# Patient Record
Sex: Male | Born: 1965 | Race: White | Hispanic: No | Marital: Married | Smoking: Former smoker
Health system: Southern US, Community
[De-identification: ages and names within clinical notes are randomized; demographics above are authoritative.]

## PROBLEM LIST (undated history)

## (undated) DIAGNOSIS — E785 Hyperlipidemia, unspecified: Secondary | ICD-10-CM

## (undated) DIAGNOSIS — G43909 Migraine, unspecified, not intractable, without status migrainosus: Secondary | ICD-10-CM

## (undated) DIAGNOSIS — I10 Essential (primary) hypertension: Secondary | ICD-10-CM

## (undated) DIAGNOSIS — G4730 Sleep apnea, unspecified: Secondary | ICD-10-CM

## (undated) HISTORY — PX: VEIN SURGERY: SHX48

## (undated) HISTORY — DX: Migraine, unspecified, not intractable, without status migrainosus: G43.909

## (undated) HISTORY — PX: APPENDECTOMY: SHX54

## (undated) HISTORY — PX: COLON SURGERY: SHX602

## (undated) HISTORY — DX: Hyperlipidemia, unspecified: E78.5

---

## 2013-03-09 ENCOUNTER — Ambulatory Visit: Payer: Self-pay | Admitting: Family Medicine

## 2013-12-11 DIAGNOSIS — R600 Localized edema: Secondary | ICD-10-CM | POA: Insufficient documentation

## 2013-12-11 DIAGNOSIS — G43109 Migraine with aura, not intractable, without status migrainosus: Secondary | ICD-10-CM | POA: Insufficient documentation

## 2014-03-18 DIAGNOSIS — G4733 Obstructive sleep apnea (adult) (pediatric): Secondary | ICD-10-CM | POA: Insufficient documentation

## 2014-04-09 ENCOUNTER — Ambulatory Visit: Payer: Self-pay | Admitting: Unknown Physician Specialty

## 2014-06-21 LAB — SURGICAL PATHOLOGY

## 2015-07-05 ENCOUNTER — Other Ambulatory Visit: Payer: Self-pay | Admitting: Internal Medicine

## 2015-07-05 DIAGNOSIS — G43019 Migraine without aura, intractable, without status migrainosus: Secondary | ICD-10-CM

## 2015-07-12 ENCOUNTER — Ambulatory Visit
Admission: RE | Admit: 2015-07-12 | Discharge: 2015-07-12 | Disposition: A | Discharge status: Home / Self Care | Payer: 59 | Source: Ambulatory Visit | Attending: Internal Medicine | Admitting: Internal Medicine

## 2015-07-12 DIAGNOSIS — G43001 Migraine without aura, not intractable, with status migrainosus: Secondary | ICD-10-CM | POA: Diagnosis not present

## 2015-07-12 DIAGNOSIS — R51 Headache: Secondary | ICD-10-CM | POA: Insufficient documentation

## 2015-07-12 DIAGNOSIS — G43019 Migraine without aura, intractable, without status migrainosus: Secondary | ICD-10-CM

## 2015-07-12 HISTORY — DX: Essential (primary) hypertension: I10

## 2015-07-12 MED ORDER — IOPAMIDOL (ISOVUE-300) INJECTION 61%
75.0000 mL | Freq: Once | INTRAVENOUS | Status: AC | PRN
  Administered 2015-07-12: 75 mL via INTRAVENOUS

## 2016-07-02 ENCOUNTER — Encounter (INDEPENDENT_AMBULATORY_CARE_PROVIDER_SITE_OTHER): Payer: Self-pay | Admitting: Vascular Surgery

## 2016-07-02 ENCOUNTER — Ambulatory Visit (INDEPENDENT_AMBULATORY_CARE_PROVIDER_SITE_OTHER): Payer: BLUE CROSS/BLUE SHIELD | Admitting: Vascular Surgery

## 2016-07-02 DIAGNOSIS — M79605 Pain in left leg: Secondary | ICD-10-CM | POA: Diagnosis not present

## 2016-07-02 DIAGNOSIS — I872 Venous insufficiency (chronic) (peripheral): Secondary | ICD-10-CM | POA: Diagnosis not present

## 2016-07-02 DIAGNOSIS — M79604 Pain in right leg: Secondary | ICD-10-CM | POA: Diagnosis not present

## 2016-07-02 DIAGNOSIS — M79609 Pain in unspecified limb: Secondary | ICD-10-CM | POA: Insufficient documentation

## 2016-07-02 DIAGNOSIS — K219 Gastro-esophageal reflux disease without esophagitis: Secondary | ICD-10-CM | POA: Insufficient documentation

## 2016-07-02 DIAGNOSIS — I83813 Varicose veins of bilateral lower extremities with pain: Secondary | ICD-10-CM

## 2016-07-02 NOTE — Progress Notes (Signed)
MRN : [id number]  Christian James is a 51 y.o. (09-21-1965) male who presents with chief complaint of  Chief Complaint  Patient presents with  . New Evaluation    Varicose Veins  .  History of Present Illness: The patient is seen for evaluation of symptomatic varicose veins. The patient relates burning and stinging which worsened steadily throughout the course of the day, particularly with standing. The patient also notes an aching and throbbing pain over the varicosities, particularly with prolonged dependent positions. The symptoms are significantly improved with elevation.  The patient also notes that during hot weather the symptoms are greatly intensified. The patient states the pain from the varicose veins interferes with work, daily exercise, shopping and household maintenance. At this point, the symptoms are persistent and severe enough that they're having a negative impact on lifestyle and are interfering with daily activities.  There is no history of DVT, PE or superficial thrombophlebitis. There is no history of ulceration or hemorrhage. The patient has a significant family history of varicose veins, his mother had stripping.   The patient has worn graduated compression in the past. At the present time the patient has not been using over-the-counter analgesics. There is no history of prior surgical intervention or sclerotherapy.    Current Meds  Medication Sig  . ACIDOPHILUS LACTOBACILLUS PO Take by mouth.  . butalbital-acetaminophen-caffeine (FIORICET, ESGIC) 50-325-40 MG tablet TAKE 1 TABLET EVERY 4 HOURS AS NEEDED FOR PAIN  . furosemide (LASIX) 20 MG tablet TAKE 1 TABLET (20 MG TOTAL) BY MOUTH ONCE DAILY.  . hydrochlorothiazide (HYDRODIURIL) 25 MG tablet TAKE 1 TABLET (25 MG TOTAL) BY MOUTH ONCE DAILY.  Marland Kitchen KLOR-CON 8 MEQ tablet Take 16 mEq by mouth daily.  Javier Docker Oil 1000 MG CAPS Take by mouth.  Marland Kitchen omeprazole (PRILOSEC) 20 MG capsule TAKE 1 CAPSULE (20 MG TOTAL) BY MOUTH  ONCE DAILY.  Marland Kitchen promethazine (PHENERGAN) 25 MG tablet Take by mouth.  . topiramate (TOPAMAX) 100 MG tablet TAKE 2 TABLETS (200 MG TOTAL) BY MOUTH NIGHTLY.    Past Medical History:  Diagnosis Date  . Hyperlipidemia   . Migraines     Past Surgical History:  Procedure Laterality Date  . APPENDECTOMY    . COLON SURGERY      Social History Social History  Substance Use Topics  . Smoking status: Former Smoker    Quit date: 2016  . Smokeless tobacco: Never Used  . Alcohol use Yes    Family History Family History  Problem Relation Age of Onset  . Varicose Veins Mother   . Stroke Mother   . Heart disease Mother   . Cancer Mother   No family history of bleeding/clotting disorders, porphyria or autoimmune disease   No Known Allergies   REVIEW OF SYSTEMS (Negative unless checked)  Constitutional: [] Weight loss  [] Fever  [] Chills Cardiac: [] Chest pain   [] Chest pressure   [] Palpitations   [] Shortness of breath when laying flat   [] Shortness of breath with exertion. Vascular:  [] Pain in legs with walking   [x] Pain in legs with standing  [] History of DVT   [] Phlebitis   [x] Swelling in legs   [x] Varicose veins   [] Non-healing ulcers Pulmonary:   [] Uses home oxygen   [] Productive cough   [] Hemoptysis   [] Wheeze  [] COPD   [] Asthma Neurologic:  [] Dizziness   [] Seizures   [] History of stroke   [] History of TIA  [] Aphasia   [] Vissual changes   [] Weakness or numbness in arm   []   Weakness or numbness in leg Musculoskeletal:   [] Joint swelling   [] Joint pain   [] Low back pain Hematologic:  [] Easy bruising  [] Easy bleeding   [] Hypercoagulable state   [] Anemic Gastrointestinal:  [] Diarrhea   [] Vomiting  [] Gastroesophageal reflux/heartburn   [] Difficulty swallowing. Genitourinary:  [] Chronic kidney disease   [] Difficult urination  [] Frequent urination   [] Blood in urine Skin:  [] Rashes   [] Ulcers  Psychological:  [] History of anxiety   []  History of major depression.  Physical  Examination  Vitals:   07/02/16 1309  BP: 117/74  Pulse: 60  Resp: 16  Weight: (!) 323 lb (146.5 kg)  Height: 6\' 2"  (1.88 m)   Body mass index is 41.47 kg/m. Gen: WD/WN, NAD Head: [place]/AT, No temporalis wasting.  Ear/Nose/Throat: Hearing grossly intact, nares w/o erythema or drainage, poor dentition Eyes: PER, EOMI, sclera nonicteric.  Neck: Supple, no masses.  No bruit or JVD.  Pulmonary:  Good air movement, clear to auscultation bilaterally, no use of accessory muscles.  Cardiac: RRR, normal S1, S2, no Murmurs. Vascular: Large varicosities present extensively greater than 10 mm left more than right.  Mild venous stasis changes to the legs bilaterally.  1+ soft pitting edema Vessel Right Left  Popliteal Palpable Palpable  PT Palpable Palpable  DP Palpable Palpable   Gastrointestinal: soft, non-distended. No guarding/no peritoneal signs.  Musculoskeletal: M/S 5/5 throughout.  No deformity or atrophy.  Neurologic: CN 2-12 intact. Pain and light touch intact in extremities.  Symmetrical.  Speech is fluent. Motor exam as listed above. Psychiatric: Judgment intact, Mood & affect appropriate for pt's clinical situation. Dermatologic: No rashes or ulcers noted.  No changes consistent with cellulitis. Lymph : No Cervical lymphadenopathy, no lichenification or skin changes of chronic lymphedema.  CBC No results found for: WBC, HGB, HCT, MCV, PLT  BMET No results found for: NA, K, CL, CO2, GLUCOSE, BUN, CREATININE, CALCIUM, GFRNONAA, GFRAA CrCl cannot be calculated (No order found.).  COAG No results found for: INR, PROTIME  Radiology No results found.  Assessment/Plan 1. Varicose veins of both lower extremities with pain  Recommend:  The patient has large symptomatic varicose veins that are painful and associated with swelling.  I have had a long discussion with the patient regarding  varicose veins and why they cause symptoms.  Patient will begin wearing graduated  compression stockings class 1 on a daily basis, beginning first thing in the morning and removing them in the evening. The patient is instructed specifically not to sleep in the stockings.    The patient  will also begin using over-the-counter analgesics such as Motrin 600 mg po TID to help control the symptoms.    In addition, behavioral modification including elevation during the day will be initiated.    Pending the results of these changes the  patient will be reevaluated in three months.   An  ultrasound of the venous system will be obtained.   Further plans will be based on the ultrasound results and whether conservative therapies are successful at eliminating the pain and swelling.   - VAS [place] LOWER EXTREMITY VENOUS REFLUX; Future  2. Chronic venous insufficiency No surgery or intervention at this point in time.    I have had a long discussion with the patient regarding venous insufficiency and why it  causes symptoms. I have discussed with the patient the chronic skin changes that accompany venous insufficiency and the long term sequela such as infection and ulceration.  Patient will begin wearing graduated compression  stockings class 1 (20-30 mmHg) or compression wraps on a daily basis a prescription was given. The patient will put the stockings on first thing in the morning and removing them in the evening. The patient is instructed specifically not to sleep in the stockings.    In addition, behavioral modification including several periods of elevation of the lower extremities during the day will be continued. I have demonstrated that proper elevation is a position with the ankles at heart level.  The patient is instructed to begin routine exercise, especially walking on a daily basis  Patient should undergo duplex ultrasound of the venous system to ensure that DVT or reflux is not present.  Following the review of the ultrasound the patient will follow up in 2-3 months to reassess  the degree of swelling and the control that graduated compression stockings or compression wraps  is offering.     3. Pain in both lower extremities See #1&2  4. Gastroesophageal reflux disease without esophagitis Continue PPI as already ordered, these medications have been reviewed and there are no changes at this time.     Hortencia Pilar, MD  07/02/2016 1:53 PM

## 2016-10-03 ENCOUNTER — Ambulatory Visit (INDEPENDENT_AMBULATORY_CARE_PROVIDER_SITE_OTHER): Payer: BLUE CROSS/BLUE SHIELD

## 2016-10-03 ENCOUNTER — Ambulatory Visit (INDEPENDENT_AMBULATORY_CARE_PROVIDER_SITE_OTHER): Payer: BLUE CROSS/BLUE SHIELD | Admitting: Vascular Surgery

## 2016-10-03 DIAGNOSIS — I83813 Varicose veins of bilateral lower extremities with pain: Secondary | ICD-10-CM | POA: Diagnosis not present

## 2017-01-03 ENCOUNTER — Ambulatory Visit (INDEPENDENT_AMBULATORY_CARE_PROVIDER_SITE_OTHER): Payer: BLUE CROSS/BLUE SHIELD | Admitting: Vascular Surgery

## 2017-01-03 ENCOUNTER — Encounter (INDEPENDENT_AMBULATORY_CARE_PROVIDER_SITE_OTHER): Payer: BLUE CROSS/BLUE SHIELD

## 2017-01-10 ENCOUNTER — Encounter (INDEPENDENT_AMBULATORY_CARE_PROVIDER_SITE_OTHER): Payer: Self-pay | Admitting: Vascular Surgery

## 2017-01-10 ENCOUNTER — Ambulatory Visit (INDEPENDENT_AMBULATORY_CARE_PROVIDER_SITE_OTHER): Payer: BLUE CROSS/BLUE SHIELD | Admitting: Vascular Surgery

## 2017-01-10 VITALS — BP 147/87 | HR 71 | Resp 16 | Wt 321.0 lb

## 2017-01-10 DIAGNOSIS — M79604 Pain in right leg: Secondary | ICD-10-CM | POA: Diagnosis not present

## 2017-01-10 DIAGNOSIS — K219 Gastro-esophageal reflux disease without esophagitis: Secondary | ICD-10-CM

## 2017-01-10 DIAGNOSIS — I872 Venous insufficiency (chronic) (peripheral): Secondary | ICD-10-CM | POA: Diagnosis not present

## 2017-01-10 DIAGNOSIS — M79605 Pain in left leg: Secondary | ICD-10-CM

## 2017-01-10 DIAGNOSIS — I83813 Varicose veins of bilateral lower extremities with pain: Secondary | ICD-10-CM | POA: Diagnosis not present

## 2017-01-11 ENCOUNTER — Encounter (INDEPENDENT_AMBULATORY_CARE_PROVIDER_SITE_OTHER): Payer: Self-pay | Admitting: Vascular Surgery

## 2017-01-11 NOTE — Progress Notes (Signed)
MRN : [id number]  Christian James is a 51 y.o. (February 17, 1966) male who presents with chief complaint of  Chief Complaint  Patient presents with  . Follow-up    51mos. BIL reflux   .  History of Present Illness: The patient returns for followup evaluation 3 months after the initial visit. The patient continues to have pain in the lower extremities with dependency. The pain is lessened with elevation. Graduated compression stockings, Class I (20-30 mmHg), have been worn but the stockings do not eliminate the leg pain. Over-the-counter analgesics do not improve the symptoms. The degree of discomfort continues to interfere with daily activities. The patient notes the pain in the legs is causing problems with daily exercise, at the workplace and even with household activities and maintenance such as standing in the kitchen preparing meals and doing dishes.   Venous ultrasound shows normal deep venous system, no evidence of acute or chronic DVT.  Superficial reflux is present in the bilateral great and small saphenous veins  Current Meds  Medication Sig  . ACIDOPHILUS LACTOBACILLUS PO Take by mouth.  . butalbital-acetaminophen-caffeine (FIORICET, ESGIC) 50-325-40 MG tablet TAKE 1 TABLET EVERY 4 HOURS AS NEEDED FOR PAIN  . furosemide (LASIX) 20 MG tablet TAKE 1 TABLET (20 MG TOTAL) BY MOUTH ONCE DAILY.  . hydrochlorothiazide (HYDRODIURIL) 25 MG tablet TAKE 1 TABLET (25 MG TOTAL) BY MOUTH ONCE DAILY.  Marland Kitchen KLOR-CON 8 MEQ tablet Take 16 mEq by mouth daily.  Javier Docker Oil 1000 MG CAPS Take by mouth.  Marland Kitchen omeprazole (PRILOSEC) 20 MG capsule TAKE 1 CAPSULE (20 MG TOTAL) BY MOUTH ONCE DAILY.  Marland Kitchen promethazine (PHENERGAN) 25 MG tablet Take by mouth.  . topiramate (TOPAMAX) 100 MG tablet TAKE 2 TABLETS (200 MG TOTAL) BY MOUTH NIGHTLY.    Past Medical History:  Diagnosis Date  . Hyperlipidemia   . Migraines     Past Surgical History:  Procedure Laterality Date  . APPENDECTOMY    . COLON SURGERY       Social History Social History   Tobacco Use  . Smoking status: Former Smoker    Last attempt to quit: 2016    Years since quitting: 2.8  . Smokeless tobacco: Never Used  Substance Use Topics  . Alcohol use: Yes  . Drug use: No    Family History Family History  Problem Relation Age of Onset  . Varicose Veins Mother   . Stroke Mother   . Heart disease Mother   . Cancer Mother     No Known Allergies   REVIEW OF SYSTEMS (Negative unless checked)  Constitutional: [] Weight loss  [] Fever  [] Chills Cardiac: [] Chest pain   [] Chest pressure   [] Palpitations   [] Shortness of breath when laying flat   [] Shortness of breath with exertion. Vascular:  [] Pain in legs with walking   [x] Pain in legs with standing[] History of DVT   [] Phlebitis   [x] Swelling in legs   [x] Varicose veins   [] Non-healing ulcers Pulmonary:   [] Uses home oxygen   [] Productive cough   [] Hemoptysis   [] Wheeze  [] COPD   [] Asthma Neurologic:  [] Dizziness   [] Seizures   [] History of stroke   [] History of TIA  [] Aphasia   [] Vissual changes   [] Weakness or numbness in arm   [] Weakness or numbness in leg Musculoskeletal:   [] Joint swelling   [] Joint pain   [] Low back pain Hematologic:  [] Easy bruising  [] Easy bleeding   [x] Hypercoagulable state   [] Anemic Gastrointestinal:  [] Diarrhea   []   Vomiting  [] Gastroesophageal reflux/heartburn   [] Difficulty swallowing. Genitourinary:  [] Chronic kidney disease   [] Difficult urination  [] Frequent urination   [] Blood in urine Skin:  [] Rashes   [] Ulcers  Psychological:  [] History of anxiety   []  History of major depression.  Physical Examination  Vitals:   01/10/17 1542  BP: (!) 147/87  Pulse: 71  Resp: 16  Weight: (!) 145.6 kg (321 lb)   Body mass index is 41.21 kg/m. Gen: WD/WN, NAD Head: [place]/AT, No temporalis wasting.  Ear/Nose/Throat: Hearing grossly intact, nares w/o erythema or drainage Eyes: PER, EOMI, sclera nonicteric.  Neck: Supple, no large masses.    Pulmonary:  Good air movement, no audible wheezing bilaterally, no use of accessory muscles.  Cardiac: RRR, no JVD Vascular: Large varicosities present extensively greater than 10 mm bilaterally.  Moderate venous stasis changes to the legs bilaterally.  2-3+ soft pitting edema Vessel Right Left  Radial Palpable Palpable  PT Palpable Palpable  DP Palpable Palpable  Gastrointestinal: Non-distended. No guarding/no peritoneal signs.  Musculoskeletal: M/S 5/5 throughout.  No deformity or atrophy.  Neurologic: CN 2-12 intact. Symmetrical.  Speech is fluent. Motor exam as listed above. Psychiatric: Judgment intact, Mood & affect appropriate for pt's clinical situation. Dermatologic: No rashes or ulcers noted.  No changes consistent with cellulitis. Lymph : No lichenification or skin changes of chronic lymphedema.  CBC No results found for: WBC, HGB, HCT, MCV, PLT  BMET No results found for: NA, K, CL, CO2, GLUCOSE, BUN, CREATININE, CALCIUM, GFRNONAA, GFRAA CrCl cannot be calculated (No order found.).  COAG No results found for: INR, PROTIME  Radiology No results found.   Assessment/Plan 1. Chronic venous insufficiency Recommend  I have reviewed my previous  discussion with the patient regarding  varicose veins and why they cause symptoms. Patient will continue  wearing graduated compression stockings class 1 on a daily basis, beginning first thing in the morning and removing them in the evening.    In addition, behavioral modification including elevation during the day was again discussed and this will continue.  The patient has utilized over the counter pain medications and has been exercising.  However, at this time conservative therapy has not alleviated the patient's symptoms of leg pain and swelling  Recommend: laser ablation of the right and  left great and small saphenous veins to eliminate the symptoms of pain and swelling of the lower extremities caused by the severe  superficial venous reflux disease.  Patient requests left leg first as this is more symptomatic to him.  2. Varicose veins of both lower extremities with pain See #1  3. Pain in both lower extremities See #1  4. Gastroesophageal reflux disease without esophagitis Continue PPI as already ordered, these medications have been reviewed and there are no changes at this time.     Hortencia Pilar, MD  01/11/2017 9:43 PM

## 2017-01-16 ENCOUNTER — Telehealth (INDEPENDENT_AMBULATORY_CARE_PROVIDER_SITE_OTHER): Payer: Self-pay | Admitting: Vascular Surgery

## 2017-01-16 NOTE — Telephone Encounter (Signed)
April and I called the patient back with instructions.

## 2017-01-16 NOTE — Telephone Encounter (Signed)
The cost of laser ablation is based on your insurance.  The patient should call her insurance company and inquire about any out of pocket cost.  The patient will be out of work the day of the laser ablation he can return to work the following day.

## 2017-01-16 NOTE — Telephone Encounter (Signed)
New Message  Pts wife verbalized she is wanting to know the cost of the procedure and steps about wearing the hose and how many days will pt be out of work.  Please f/u

## 2017-02-07 ENCOUNTER — Telehealth (INDEPENDENT_AMBULATORY_CARE_PROVIDER_SITE_OTHER): Payer: Self-pay | Admitting: Vascular Surgery

## 2017-02-07 NOTE — Telephone Encounter (Signed)
New Message   *STAT* If patient is at the pharmacy, call can be transferred to refill team.   1. Which medications need to be refilled? (please list name of each medication and dose if known) xanax  2. Which pharmacy/location (including street and city if local pharmacy) is medication to be sent to? [address] [place], [place] [address]  3. Do they need a 30 day or 90 day supply? 30 day supply  Pt is needing rx for procedure he has on Monday and rx that went out with paperwork went to wrong address and got lost in mail.  Pt updated address [phone].

## 2017-02-07 NOTE — Telephone Encounter (Signed)
Called the patient's prescription in because he states that he never received his packet of paperwork in the mail that would have had the prescription in it for the Xanax .5mg 

## 2017-02-11 ENCOUNTER — Ambulatory Visit (INDEPENDENT_AMBULATORY_CARE_PROVIDER_SITE_OTHER): Payer: BLUE CROSS/BLUE SHIELD | Admitting: Vascular Surgery

## 2017-02-11 ENCOUNTER — Encounter (INDEPENDENT_AMBULATORY_CARE_PROVIDER_SITE_OTHER): Payer: Self-pay | Admitting: Vascular Surgery

## 2017-02-11 VITALS — BP 116/73 | HR 82 | Resp 15 | Ht 74.0 in | Wt 324.0 lb

## 2017-02-11 DIAGNOSIS — I83813 Varicose veins of bilateral lower extremities with pain: Secondary | ICD-10-CM | POA: Diagnosis not present

## 2017-02-13 ENCOUNTER — Other Ambulatory Visit (INDEPENDENT_AMBULATORY_CARE_PROVIDER_SITE_OTHER): Payer: Self-pay | Admitting: Vascular Surgery

## 2017-02-13 DIAGNOSIS — I83893 Varicose veins of bilateral lower extremities with other complications: Secondary | ICD-10-CM

## 2017-02-15 ENCOUNTER — Ambulatory Visit (INDEPENDENT_AMBULATORY_CARE_PROVIDER_SITE_OTHER): Payer: BLUE CROSS/BLUE SHIELD

## 2017-02-15 DIAGNOSIS — I83893 Varicose veins of bilateral lower extremities with other complications: Secondary | ICD-10-CM | POA: Diagnosis not present

## 2017-02-16 ENCOUNTER — Encounter (INDEPENDENT_AMBULATORY_CARE_PROVIDER_SITE_OTHER): Payer: Self-pay | Admitting: Vascular Surgery

## 2017-02-16 NOTE — Progress Notes (Signed)
    MRN : [id number]  Christian James is a 51 y.o. (11/29/1965) male who presents with chief complaint of  Chief Complaint  Patient presents with  . Venous Insufficiency    Right GSV laser ablation  .    The patient's right lower extremity was sterilely prepped and draped.  The ultrasound machine was used to visualize the great saphenous vein throughout its course.  A segment below the knee was selected for access.  The saphenous vein was accessed without difficulty using ultrasound guidance with a micropuncture needle.   An 0.018  wire was placed beyond the saphenofemoral junction through the sheath and the microneedle was removed.  The 65 cm sheath was then placed over the wire and the wire and dilator were removed.  The laser fiber was placed through the sheath and its tip was placed approximately 2 cm below the saphenofemoral junction.  Tumescent anesthesia was then created with a dilute lidocaine solution.  Laser energy was then delivered with constant withdrawal of the sheath and laser fiber.  Approximately 1090 Joules of energy were delivered over a length of 34 cm.  Sterile dressings were placed.  The patient tolerated the procedure well without complications.

## 2017-03-07 ENCOUNTER — Encounter (INDEPENDENT_AMBULATORY_CARE_PROVIDER_SITE_OTHER): Payer: Self-pay | Admitting: Vascular Surgery

## 2017-03-07 ENCOUNTER — Ambulatory Visit (INDEPENDENT_AMBULATORY_CARE_PROVIDER_SITE_OTHER): Payer: BLUE CROSS/BLUE SHIELD | Admitting: Vascular Surgery

## 2017-03-07 VITALS — BP 116/78 | HR 67 | Resp 17 | Wt 317.6 lb

## 2017-03-07 DIAGNOSIS — I872 Venous insufficiency (chronic) (peripheral): Secondary | ICD-10-CM

## 2017-03-07 DIAGNOSIS — I83813 Varicose veins of bilateral lower extremities with pain: Secondary | ICD-10-CM

## 2017-03-09 ENCOUNTER — Encounter (INDEPENDENT_AMBULATORY_CARE_PROVIDER_SITE_OTHER): Payer: Self-pay | Admitting: Vascular Surgery

## 2017-03-09 NOTE — Progress Notes (Signed)
    MRN : [id number]  Christian James is a 52 y.o. (1966-01-04) male who presents with chief complaint of painful varicose veins.    The patient's left lower extremity was sterilely prepped and draped.  The ultrasound machine was used to visualize the left great saphenous vein throughout its course.  A segment below the knee was selected for access.  The saphenous vein was accessed without difficulty using ultrasound guidance with a micropuncture needle.   An 0.018  wire was placed beyond the saphenofemoral junction through the sheath and the microneedle was removed.  The 65 cm sheath was then placed over the wire and the wire and dilator were removed.  The laser fiber was placed through the sheath and its tip was placed approximately 2 cm below the saphenofemoral junction.  Tumescent anesthesia was then created with a dilute lidocaine solution.  Laser energy was then delivered with constant withdrawal of the sheath and laser fiber.  Approximately 1437 Joules of energy were delivered over a length of 43 cm.  Sterile dressings were placed.  The patient tolerated the procedure well without complications.

## 2017-03-11 ENCOUNTER — Encounter (INDEPENDENT_AMBULATORY_CARE_PROVIDER_SITE_OTHER): Payer: BLUE CROSS/BLUE SHIELD

## 2017-03-11 ENCOUNTER — Other Ambulatory Visit (INDEPENDENT_AMBULATORY_CARE_PROVIDER_SITE_OTHER): Payer: Self-pay | Admitting: Vascular Surgery

## 2017-03-11 DIAGNOSIS — I872 Venous insufficiency (chronic) (peripheral): Secondary | ICD-10-CM

## 2017-03-12 ENCOUNTER — Ambulatory Visit (INDEPENDENT_AMBULATORY_CARE_PROVIDER_SITE_OTHER): Payer: BLUE CROSS/BLUE SHIELD

## 2017-03-12 DIAGNOSIS — I872 Venous insufficiency (chronic) (peripheral): Secondary | ICD-10-CM | POA: Diagnosis not present

## 2017-04-04 ENCOUNTER — Ambulatory Visit (INDEPENDENT_AMBULATORY_CARE_PROVIDER_SITE_OTHER): Payer: BLUE CROSS/BLUE SHIELD | Admitting: Vascular Surgery

## 2017-04-04 ENCOUNTER — Encounter (INDEPENDENT_AMBULATORY_CARE_PROVIDER_SITE_OTHER): Payer: Self-pay | Admitting: Vascular Surgery

## 2017-04-04 VITALS — BP 120/75 | HR 70 | Resp 16 | Ht 74.0 in | Wt 321.0 lb

## 2017-04-04 DIAGNOSIS — I83813 Varicose veins of bilateral lower extremities with pain: Secondary | ICD-10-CM | POA: Diagnosis not present

## 2017-04-04 DIAGNOSIS — I872 Venous insufficiency (chronic) (peripheral): Secondary | ICD-10-CM | POA: Diagnosis not present

## 2017-04-07 ENCOUNTER — Encounter (INDEPENDENT_AMBULATORY_CARE_PROVIDER_SITE_OTHER): Payer: Self-pay | Admitting: Vascular Surgery

## 2017-04-07 NOTE — Progress Notes (Signed)
MRN : [id number]  Christian James is a 52 y.o. ([birth year]) male who presents with chief complaint of  Chief Complaint  Patient presents with  . Follow-up    3-4 week f/u  .  History of Present Illness: The patient returns to the office for followup status post laser ablation of the the bilateral great saphenous veins.  The patient note significant improvement in the lower extremity pain but not resolution of the symptoms. The patient notes multiple residual varicosities bilaterally which continued to hurt with dependent positions and remained tender to palpation. The patient's swelling is minimally from preoperative status. The patient continues to wear graduated compression stockings on a daily basis but these are not eliminating the pain and discomfort. The patient continues to use over-the-counter anti-inflammatory medications to treat the pain and related symptoms but this has not given the patient relief. The patient notes the pain in the lower extremities is causing problems with daily exercise, problems at work and even with household activities such as preparing meals and doing dishes.  The patient is otherwise done well and there have been no complications related to the laser procedure or interval changes in the patient's overall   Post laser ultrasound shows successful ablation of the bilateal GSV   Current Meds  Medication Sig  . ACIDOPHILUS LACTOBACILLUS PO Take by mouth.  . butalbital-acetaminophen-caffeine (FIORICET, ESGIC) 50-325-40 MG tablet TAKE 1 TABLET EVERY 4 HOURS AS NEEDED FOR PAIN  . furosemide (LASIX) 20 MG tablet TAKE 1 TABLET (20 MG TOTAL) BY MOUTH ONCE DAILY.  . hydrochlorothiazide (HYDRODIURIL) 25 MG tablet TAKE 1 TABLET (25 MG TOTAL) BY MOUTH ONCE DAILY.  Marland Kitchen KLOR-CON 8 MEQ tablet Take 16 mEq by mouth daily.  Marland Kitchen KLOR-CON M20 20 MEQ tablet TAKE 1 TABLET (20 MEQ TOTAL) BY MOUTH ONCE DAILY.  Marland Kitchen Krill Oil 1000 MG CAPS Take by mouth.  Marland Kitchen omeprazole (PRILOSEC) 20 MG  capsule TAKE 1 CAPSULE (20 MG TOTAL) BY MOUTH ONCE DAILY.  Marland Kitchen promethazine (PHENERGAN) 25 MG tablet Take by mouth.  . topiramate (TOPAMAX) 100 MG tablet TAKE 2 TABLETS (200 MG TOTAL) BY MOUTH NIGHTLY.    Past Medical History:  Diagnosis Date  . Hyperlipidemia   . Migraines     Past Surgical History:  Procedure Laterality Date  . APPENDECTOMY    . COLON SURGERY      Social History Social History   Tobacco Use  . Smoking status: Former Smoker    Last attempt to quit: 2016    Years since quitting: 3.1  . Smokeless tobacco: Never Used  Substance Use Topics  . Alcohol use: Yes  . Drug use: No    Family History Family History  Problem Relation Age of Onset  . Varicose Veins Mother   . Stroke Mother   . Heart disease Mother   . Cancer Mother     No Known Allergies   REVIEW OF SYSTEMS (Negative unless checked)  Constitutional: [] Weight loss  [] Fever  [] Chills Cardiac: [] Chest pain   [] Chest pressure   [] Palpitations   [] Shortness of breath when laying flat   [] Shortness of breath with exertion. Vascular:  [] Pain in legs with walking   [x] Pain in legs at rest  [] History of DVT   [] Phlebitis   [x] Swelling in legs   [x] Varicose veins   [] Non-healing ulcers Pulmonary:   [] Uses home oxygen   [] Productive cough   [] Hemoptysis   [] Wheeze  [] COPD   [] Asthma Neurologic:  [] Dizziness   [] Seizures   []   History of stroke   [] History of TIA  [] Aphasia   [] Vissual changes   [] Weakness or numbness in arm   [] Weakness or numbness in leg Musculoskeletal:   [] Joint swelling   [] Joint pain   [] Low back pain Hematologic:  [] Easy bruising  [] Easy bleeding   [] Hypercoagulable state   [] Anemic Gastrointestinal:  [] Diarrhea   [] Vomiting  [] Gastroesophageal reflux/heartburn   [] Difficulty swallowing. Genitourinary:  [] Chronic kidney disease   [] Difficult urination  [] Frequent urination   [] Blood in urine Skin:  [] Rashes   [] Ulcers  Psychological:  [] History of anxiety   []  History of major  depression.  Physical Examination  Vitals:   04/04/17 1423  BP: 120/75  Pulse: 70  Resp: 16  Weight: (!) 321 lb (145.6 kg)  Height: 6\' 2"  (1.88 m)   Body mass index is 41.21 kg/m. Gen: WD/WN, NAD Head: [place]/AT, No temporalis wasting.  Ear/Nose/Throat: Hearing grossly intact, nares w/o erythema or drainage Eyes: PER, EOMI, sclera nonicteric.  Neck: Supple, no large masses.   Pulmonary:  Good air movement, no audible wheezing bilaterally, no use of accessory muscles.  Cardiac: RRR, no JVD Vascular: Large varicosities present extensively greater than 10 mm bilateral.  Mild venous stasis changes to the legs bilaterally.  2+ soft pitting edema Vessel Right Left  Radial Palpable Palpable  PT Palpable Palpable  DP Palpable Palpable  Gastrointestinal: Non-distended. No guarding/no peritoneal signs.  Musculoskeletal: M/S 5/5 throughout.  No deformity or atrophy.  Neurologic: CN 2-12 intact. Symmetrical.  Speech is fluent. Motor exam as listed above. Psychiatric: Judgment intact, Mood & affect appropriate for pt's clinical situation. Dermatologic: No rashes or ulcers noted.  No changes consistent with cellulitis. Lymph : No lichenification or skin changes of chronic lymphedema.  CBC No results found for: WBC, HGB, HCT, MCV, PLT  BMET No results found for: NA, K, CL, CO2, GLUCOSE, BUN, CREATININE, CALCIUM, GFRNONAA, GFRAA CrCl cannot be calculated (No order found.).  COAG No results found for: INR, PROTIME  Radiology   Assessment/Plan 1. Varicose veins of both lower extremities with pain Recommend:  The patient has had successful ablation of the previously incompetent saphenous venous system but still has persistent symptoms of pain and swelling that are having a negative impact on daily life and daily activities.  Patient should undergo injection sclerotherapy to treat the residual varicosities.  The risks, benefits and alternative therapies were reviewed in detail with the  patient.  All questions were answered.  The patient agrees to proceed with sclerotherapy at their convenience.  The patient will continue wearing the graduated compression stockings and using the over-the-counter pain medications to treat her symptoms.     2. Chronic venous insufficiency No surgery or intervention at this point in time.    I have had a long discussion with the patient regarding venous insufficiency and why it  causes symptoms. I have discussed with the patient the chronic skin changes that accompany venous insufficiency and the long term sequela such as infection and ulceration.  Patient will begin wearing graduated compression stockings class 1 (20-30 mmHg) or compression wraps on a daily basis a prescription was given. The patient will put the stockings on first thing in the morning and removing them in the evening. The patient is instructed specifically not to sleep in the stockings.    In addition, behavioral modification including several periods of elevation of the lower extremities during the day will be continued. I have demonstrated that proper elevation is a position with the  ankles at heart level.  The patient is instructed to begin routine exercise, especially walking on a daily basis      Hortencia Pilar, MD  04/07/2017 10:59 AM

## 2017-05-21 DIAGNOSIS — D369 Benign neoplasm, unspecified site: Secondary | ICD-10-CM | POA: Insufficient documentation

## 2017-05-21 DIAGNOSIS — D510 Vitamin B12 deficiency anemia due to intrinsic factor deficiency: Secondary | ICD-10-CM | POA: Insufficient documentation

## 2017-06-17 ENCOUNTER — Ambulatory Visit (INDEPENDENT_AMBULATORY_CARE_PROVIDER_SITE_OTHER): Payer: BLUE CROSS/BLUE SHIELD | Admitting: Vascular Surgery

## 2017-07-08 ENCOUNTER — Encounter (INDEPENDENT_AMBULATORY_CARE_PROVIDER_SITE_OTHER): Payer: Self-pay | Admitting: Vascular Surgery

## 2017-07-08 ENCOUNTER — Ambulatory Visit (INDEPENDENT_AMBULATORY_CARE_PROVIDER_SITE_OTHER): Payer: BLUE CROSS/BLUE SHIELD | Admitting: Vascular Surgery

## 2017-07-08 VITALS — BP 127/80 | HR 60 | Resp 16 | Ht 74.0 in | Wt 323.0 lb

## 2017-07-08 DIAGNOSIS — I83812 Varicose veins of left lower extremities with pain: Secondary | ICD-10-CM

## 2017-07-08 DIAGNOSIS — I83813 Varicose veins of bilateral lower extremities with pain: Secondary | ICD-10-CM

## 2017-07-08 DIAGNOSIS — I83811 Varicose veins of right lower extremities with pain: Secondary | ICD-10-CM | POA: Diagnosis not present

## 2017-07-08 NOTE — Progress Notes (Signed)
Varicose veins of bilatera  lower extremity with inflammation (454.1  I83.10) Current Plans   Indication: Patient presents with symptomatic varicose veins of the bilateral  lower extremity.   Procedure: Sclerotherapy using hypertonic saline mixed with 1% Lidocaine was performed on the bilateral lower extremity. Compression wraps were placed. The patient tolerated the procedure well. 

## 2017-08-05 ENCOUNTER — Encounter (INDEPENDENT_AMBULATORY_CARE_PROVIDER_SITE_OTHER): Payer: Self-pay | Admitting: Vascular Surgery

## 2017-08-05 ENCOUNTER — Ambulatory Visit (INDEPENDENT_AMBULATORY_CARE_PROVIDER_SITE_OTHER): Payer: BLUE CROSS/BLUE SHIELD | Admitting: Vascular Surgery

## 2017-08-05 VITALS — BP 116/78 | HR 62 | Resp 17 | Ht 74.0 in | Wt 319.0 lb

## 2017-08-05 DIAGNOSIS — I83811 Varicose veins of right lower extremities with pain: Secondary | ICD-10-CM | POA: Diagnosis not present

## 2017-08-05 DIAGNOSIS — I83812 Varicose veins of left lower extremities with pain: Secondary | ICD-10-CM

## 2017-08-05 DIAGNOSIS — I83813 Varicose veins of bilateral lower extremities with pain: Secondary | ICD-10-CM

## 2017-08-05 NOTE — Progress Notes (Signed)
Varicose veins of bilateral  lower extremity with inflammation (454.1  I83.10) Current Plans   Indication: Patient presents with symptomatic varicose veins of the bilateral  lower extremity.   Procedure: Sclerotherapy using hypertonic saline mixed with 1% Lidocaine was performed on the bilateral lower extremity. Compression wraps were placed. The patient tolerated the procedure well. 

## 2017-10-17 ENCOUNTER — Other Ambulatory Visit: Payer: Self-pay

## 2017-10-17 ENCOUNTER — Ambulatory Visit
Admission: EM | Admit: 2017-10-17 | Discharge: 2017-10-17 | Disposition: A | Discharge status: Home / Self Care | Payer: BLUE CROSS/BLUE SHIELD | Attending: Family Medicine | Admitting: Family Medicine

## 2017-10-17 DIAGNOSIS — L03031 Cellulitis of right toe: Secondary | ICD-10-CM

## 2017-10-17 MED ORDER — SULFAMETHOXAZOLE-TRIMETHOPRIM 800-160 MG PO TABS: 1.0000 | ORAL_TABLET | Freq: Two times a day (BID) | ORAL | 0 refills | Status: AC

## 2017-10-17 MED ORDER — MUPIROCIN 2 % EX OINT: 1.0000 "application " | TOPICAL_OINTMENT | Freq: Two times a day (BID) | CUTANEOUS | 0 refills | Status: AC

## 2017-10-17 NOTE — ED Provider Notes (Signed)
[facility]    CSN: [id number] Arrival date & time: 10/17/17  1719   History   Chief Complaint Chief Complaint  Patient presents with  . Toe Pain    HPI   52 year old male presents with right great toe pain.  Patient reports that it started 2 days ago.  He states that he noticed redness and swelling around the nailbed.  He states that he squeezed it and got some pus out.  He states that the redness has now spread to around the nailbed completely.  Mild to moderate pain.  No known inciting factor.  No known exacerbating or relieving factors.  No fevers or chills.  No other associated symptoms.  No other complaints.  Past Medical History:  Diagnosis Date  . Hyperlipidemia   . Migraines     Patient Active Problem List   Diagnosis Date Noted  . Varicose veins of both lower extremities with pain 07/02/2016  . Chronic venous insufficiency 07/02/2016  . Pain in limb 07/02/2016  . GERD (gastroesophageal reflux disease) 07/02/2016    Past Surgical History:  Procedure Laterality Date  . APPENDECTOMY    . COLON SURGERY      Home Medications    Prior to Admission medications   Medication Sig Start Date End Date Taking? Authorizing Provider  ACIDOPHILUS LACTOBACILLUS PO Take by mouth.   Yes [provider]  butalbital-acetaminophen-caffeine (FIORICET, ESGIC) 50-325-40 MG tablet TAKE 1 TABLET EVERY 4 HOURS AS NEEDED FOR PAIN 01/25/16  Yes [provider]  furosemide (LASIX) 20 MG tablet TAKE 1 TABLET (20 MG TOTAL) BY MOUTH ONCE DAILY. 05/24/16  Yes [provider]  hydrochlorothiazide (HYDRODIURIL) 25 MG tablet TAKE 1 TABLET (25 MG TOTAL) BY MOUTH ONCE DAILY. 04/28/16  Yes [provider]  KLOR-CON 8 MEQ tablet Take 16 mEq by mouth daily. 05/24/16  Yes [provider]  KLOR-CON M20 20 MEQ tablet TAKE 1 TABLET (20 MEQ TOTAL) BY MOUTH ONCE DAILY. 01/10/17  Yes [provider]  Javier Docker Oil 1000 MG CAPS Take by mouth.   Yes  [provider]  omeprazole (PRILOSEC) 20 MG capsule TAKE 1 CAPSULE (20 MG TOTAL) BY MOUTH ONCE DAILY. 05/24/16  Yes [provider]  promethazine (PHENERGAN) 25 MG tablet Take by mouth. 01/24/16  Yes [provider]  topiramate (TOPAMAX) 100 MG tablet TAKE 2 TABLETS (200 MG TOTAL) BY MOUTH NIGHTLY. 05/24/16  Yes [provider]  mupirocin ointment (BACTROBAN) 2 % Apply 1 application topically 2 (two) times daily for 7 days. 10/17/17 10/24/17  Coral Spikes, DO  sulfamethoxazole-trimethoprim (BACTRIM DS,SEPTRA DS) 800-160 MG tablet Take 1 tablet by mouth 2 (two) times daily for 7 days. 10/17/17 10/24/17  Coral Spikes, DO    Family History Family History  Problem Relation Age of Onset  . Varicose Veins Mother   . Stroke Mother   . Heart disease Mother   . Cancer Mother     Social History Social History   Tobacco Use  . Smoking status: Former Smoker    Last attempt to quit: 2016    Years since quitting: 3.6  . Smokeless tobacco: Never Used  Substance Use Topics  . Alcohol use: Yes    Comment: seldom  . Drug use: No     Allergies   Patient has no known allergies.   Review of Systems Review of Systems  Constitutional: Negative.   Musculoskeletal:       R great toe pain.  Skin:  Redness around the nail bed (R great toe).   Physical Exam Triage Vital Signs ED Triage Vitals  Enc Vitals Group     BP 10/17/17 1732 131/87     Pulse Rate 10/17/17 1732 85     Resp 10/17/17 1732 18     Temp 10/17/17 1732 98 F (36.7 C)     Temp Source 10/17/17 1732 Oral     SpO2 10/17/17 1732 98 %     Weight 10/17/17 1733 (!) 325 lb (147.4 kg)     Height 10/17/17 1733 6\' 2"  (1.88 m)     Head Circumference --      Peak Flow --      Pain Score 10/17/17 1733 2     Pain Loc --      Pain Edu? --      Excl. in [place]? --    Updated Vital Signs BP 131/87 (BP Location: Left Arm)   Pulse 85   Temp 98 F (36.7 C) (Oral)   Resp 18   Ht 6\' 2"  (1.88 m)   Wt  (!) 147.4 kg   SpO2 98%   BMI 41.73 kg/m   Visual Acuity Right Eye Distance:   Left Eye Distance:   Bilateral Distance:    Right Eye Near:   Left Eye Near:    Bilateral Near:     Physical Exam  Constitutional: He is oriented to person, place, and time. He appears well-developed. No distress.  Cardiovascular: Normal rate and regular rhythm.  Pulmonary/Chest: Effort normal and breath sounds normal. He has no wheezes. He has no rales.  Neurological: He is alert and oriented to person, place, and time.  Skin:  Right great toe -erythema noted around the nailbed throughout.  Mildly tender to palpation.  No fluctuance/purulence noted.  Psychiatric: He has a normal mood and affect. His behavior is normal.  Nursing note and vitals reviewed.  UC Treatments / Results  Labs (all labs ordered are listed, but only abnormal results are displayed) Labs Reviewed - No data to display  EKG None  Radiology No results found.  Procedures Procedures (including critical care time)  Medications Ordered in UC Medications - No data to display  Initial Impression / Assessment and Plan / UC Course  I have reviewed the triage vital signs and the nursing notes.  Pertinent labs & imaging results that were available during my care of the patient were reviewed by me and considered in my medical decision making (see chart for details).    52 year old male presents with acute paronychia.  Treating with Bactrim and Bactroban ointment.  Final Clinical Impressions(s) / UC Diagnoses   Final diagnoses:  Paronychia of great toe of right foot   Discharge Instructions   None    ED Prescriptions    Medication Sig Dispense Auth. Provider   sulfamethoxazole-trimethoprim (BACTRIM DS,SEPTRA DS) 800-160 MG tablet Take 1 tablet by mouth 2 (two) times daily for 7 days. 14 tablet Deddrick Saindon G, DO   mupirocin ointment (BACTROBAN) 2 % Apply 1 application topically 2 (two) times daily for 7 days. 22 g Coral Spikes, DO     Controlled Substance Prescriptions [place] Controlled Substance Registry consulted? Not Applicable   Coral Spikes, DO 10/17/17 1813

## 2017-10-17 NOTE — ED Triage Notes (Signed)
Patient complains of right big tow pain that started 2 days ago. Patient has deep redness around nail, and sensitive to the touch.

## 2017-12-02 DIAGNOSIS — R7989 Other specified abnormal findings of blood chemistry: Secondary | ICD-10-CM | POA: Insufficient documentation

## 2017-12-09 DIAGNOSIS — Z6841 Body Mass Index (BMI) 40.0 and over, adult: Secondary | ICD-10-CM

## 2017-12-12 ENCOUNTER — Other Ambulatory Visit: Payer: Self-pay | Admitting: Physician Assistant

## 2017-12-12 ENCOUNTER — Ambulatory Visit
Admission: RE | Admit: 2017-12-12 | Discharge: 2017-12-12 | Disposition: A | Discharge status: Home / Self Care | Payer: BLUE CROSS/BLUE SHIELD | Source: Ambulatory Visit | Attending: Physician Assistant | Admitting: Physician Assistant

## 2017-12-12 DIAGNOSIS — N433 Hydrocele, unspecified: Secondary | ICD-10-CM | POA: Insufficient documentation

## 2017-12-12 DIAGNOSIS — N503 Cyst of epididymis: Secondary | ICD-10-CM | POA: Insufficient documentation

## 2017-12-12 DIAGNOSIS — N5089 Other specified disorders of the male genital organs: Secondary | ICD-10-CM | POA: Diagnosis present

## 2017-12-26 ENCOUNTER — Ambulatory Visit (INDEPENDENT_AMBULATORY_CARE_PROVIDER_SITE_OTHER): Payer: BLUE CROSS/BLUE SHIELD | Admitting: Urology

## 2017-12-26 ENCOUNTER — Encounter: Payer: Self-pay | Admitting: Urology

## 2017-12-26 VITALS — BP 117/77 | HR 75 | Ht 74.0 in | Wt 326.0 lb

## 2017-12-26 DIAGNOSIS — N503 Cyst of epididymis: Secondary | ICD-10-CM

## 2017-12-26 NOTE — Progress Notes (Signed)
   12/26/2017 1:02 PM   Christian James [birth year] [id number]  Referring provider: Rusty Aus, MD Christian James, [place] [address]  CC: Left epididymal cyst  HPI: I had the pleasure of seeing Christian James in urology clinic today in consultation for left epididymal cyst from Christian James, [place].  A scrotal ultrasound was performed on 12/12/2017 which showed no intratesticular masses or any solid masses within the scrotum, however there was an almost 4 cm left epididymal cyst.  He is unsure how long this is been present.  It is not particularly painful to him, however he does occasionally have some mild discomfort when sitting or working.  He denies any gross hematuria or other urologic symptoms.  He denies any weight loss or bone pain.   PMH: Past Medical History:  Diagnosis Date  . Hyperlipidemia   . Migraines     Surgical History: Past Surgical History:  Procedure Laterality Date  . APPENDECTOMY    . COLON SURGERY      Allergies: No Known Allergies  Family History: Family History  Problem Relation Age of Onset  . Varicose Veins Mother   . Stroke Mother   . Heart disease Mother   . Cancer Mother     Social History:  reports that he quit smoking about 3 years ago. He has never used smokeless tobacco. He reports that he drinks alcohol. He reports that he does not use drugs.  ROS: Please see flowsheet from today's date for complete review of systems.  Physical Exam: BP 117/77   Pulse 75   Ht 6\' 2"  (1.88 m)   Wt (!) 326 lb (147.9 kg)   BMI 41.86 kg/m    Constitutional:  Alert and oriented, No acute distress. Cardiovascular: No clubbing, cyanosis, or edema. Respiratory: Normal respiratory effort, no increased work of breathing. GI: Abdomen is soft, nontender, nondistended, no abdominal masses GU: No CVA tenderness, phallus without lesions, widely patent meatus Testicles descended and palpable bilaterally, ~20cc each.  Palpable left spherical scrotal mass consistent with epididymal cyst seen on ultrasound Lymph: No cervical or inguinal lymphadenopathy. Skin: No rashes, bruises or suspicious lesions. Neurologic: Grossly intact, no focal deficits, moving all 4 extremities. Psychiatric: Normal mood and affect.  Pertinent Imaging: I have personally reviewed the scrotal ultrasound, no testicular masses, 4 cm left epididymal cyst  Assessment & Plan:   In summary, Christian James is a relatively healthy 52 year old male with a left benign-appearing 4 cm epididymal cyst on scrotal ultrasound.  He has minimally bothered by this at this time.  We discussed options including observation, scrotal support, or excision with surgery.  Since he is minimally bothered at this time, he would like to watch this for the next few months and repeat an ultrasound in 3 months to see if it is enlarging.  If it continues to enlarge, or is causing him pain, we would proceed with scrotal exploration and excision at that time.  RTC 3 months for scrotal ultrasound and exam  Christian James, [place] [address] [place], [place] [address] [phone]

## 2018-03-31 ENCOUNTER — Encounter: Payer: Self-pay | Admitting: Urology

## 2018-03-31 ENCOUNTER — Ambulatory Visit: Payer: BLUE CROSS/BLUE SHIELD | Admitting: Urology

## 2018-03-31 VITALS — BP 124/81 | HR 83 | Ht 74.0 in | Wt 331.0 lb

## 2018-03-31 DIAGNOSIS — N503 Cyst of epididymis: Secondary | ICD-10-CM

## 2018-03-31 NOTE — Progress Notes (Signed)
   03/31/2018 3:57 PM   Christian James 24-Jan-1966 [id number]  Reason for visit: Follow up left epididymal cyst  HPI: I saw Christian James back in urology clinic for follow-up of his left epididymal cyst.  He initially had had a scrotal ultrasound done in October 2019 which showed no solid or intratesticular masses, but a 4 cm left epididymal cyst.  We had previously discussed options including observation versus surgical excision.  It is minimally bothersome to him, and he elected for observation.  It has shrunk down in size over the last few months to closer to 2 cm now.  He denies any difficulty with urination, scrotal pain, or weight loss.  There are no aggravating or alleviating factors.  Severity is mild.   ROS: Please see flowsheet from today's date for complete review of systems.  Physical Exam: BP 124/81   Pulse 83   Ht 6\' 2"  (1.88 m)   Wt (!) 331 lb (150.1 kg)   BMI 42.50 kg/m    Constitutional:  Alert and oriented, No acute distress.  Obese Respiratory: Normal respiratory effort, no increased work of breathing. GI: Abdomen is soft, nontender, nondistended, no abdominal masses GU: No CVA tenderness.  Left 2 cm non-tender palpable cyst, consistent with epididymal cyst.  No testicular masses.  No erythema or edema. Skin: No rashes, bruises or suspicious lesions. Neurologic: Grossly intact, no focal deficits, moving all 4 extremities. Psychiatric: Normal mood and affect  Assessment & Plan:   In summary, the patient is a 53 year old male with a left epididymal cyst that has decreased in size over the last few months.  This is not bothersome to him at all.  We discussed the options again including observation versus surgical excision.  Indications for surgical excision would be increasing size and pain.  We discussed the risks of surgery including bleeding and infection.  Continue observation of left epididymal cyst, follow-up as needed  A total of 15 minutes were spent  face-to-face with the patient, greater than 50% was spent in patient education, counseling, and coordination of care regarding left epididymal cyst, and options including observation and surgical excision.   Christian James, [facility] [address] [place], [place] [address] [phone]

## 2019-04-02 DIAGNOSIS — Z9989 Dependence on other enabling machines and devices: Secondary | ICD-10-CM | POA: Diagnosis not present

## 2019-04-02 DIAGNOSIS — G4733 Obstructive sleep apnea (adult) (pediatric): Secondary | ICD-10-CM | POA: Diagnosis not present

## 2019-04-13 DIAGNOSIS — G4733 Obstructive sleep apnea (adult) (pediatric): Secondary | ICD-10-CM | POA: Diagnosis not present

## 2019-05-11 DIAGNOSIS — G4733 Obstructive sleep apnea (adult) (pediatric): Secondary | ICD-10-CM | POA: Diagnosis not present

## 2019-06-05 DIAGNOSIS — K219 Gastro-esophageal reflux disease without esophagitis: Secondary | ICD-10-CM | POA: Diagnosis not present

## 2019-06-05 DIAGNOSIS — G4733 Obstructive sleep apnea (adult) (pediatric): Secondary | ICD-10-CM | POA: Diagnosis not present

## 2019-06-05 DIAGNOSIS — Z86010 Personal history of colonic polyps: Secondary | ICD-10-CM | POA: Diagnosis not present

## 2019-06-11 DIAGNOSIS — G4733 Obstructive sleep apnea (adult) (pediatric): Secondary | ICD-10-CM | POA: Diagnosis not present

## 2019-09-14 ENCOUNTER — Other Ambulatory Visit
Admission: RE | Admit: 2019-09-14 | Discharge: 2019-09-14 | Disposition: A | Discharge status: Home / Self Care | Payer: BC Managed Care – PPO | Source: Ambulatory Visit | Attending: Internal Medicine | Admitting: Internal Medicine

## 2019-09-14 ENCOUNTER — Other Ambulatory Visit: Payer: Self-pay

## 2019-09-14 DIAGNOSIS — Z01812 Encounter for preprocedural laboratory examination: Secondary | ICD-10-CM | POA: Diagnosis not present

## 2019-09-14 DIAGNOSIS — Z20822 Contact with and (suspected) exposure to covid-19: Secondary | ICD-10-CM | POA: Insufficient documentation

## 2019-09-14 LAB — SARS CORONAVIRUS 2 (TAT 6-24 HRS): SARS Coronavirus 2: NEGATIVE

## 2019-09-16 ENCOUNTER — Ambulatory Visit: Payer: BC Managed Care – PPO | Admitting: Certified Registered"

## 2019-09-16 ENCOUNTER — Encounter
Admission: RE | Disposition: A | Discharge status: Home / Self Care | Payer: Self-pay | Source: Home / Self Care | Attending: Internal Medicine

## 2019-09-16 ENCOUNTER — Other Ambulatory Visit: Payer: Self-pay

## 2019-09-16 ENCOUNTER — Ambulatory Visit
Admission: RE | Admit: 2019-09-16 | Discharge: 2019-09-16 | Disposition: A | Discharge status: Home / Self Care | Payer: BC Managed Care – PPO | Attending: Internal Medicine | Admitting: Internal Medicine

## 2019-09-16 ENCOUNTER — Encounter: Payer: Self-pay | Admitting: Internal Medicine

## 2019-09-16 DIAGNOSIS — Z1211 Encounter for screening for malignant neoplasm of colon: Secondary | ICD-10-CM | POA: Insufficient documentation

## 2019-09-16 DIAGNOSIS — G4730 Sleep apnea, unspecified: Secondary | ICD-10-CM | POA: Insufficient documentation

## 2019-09-16 DIAGNOSIS — E785 Hyperlipidemia, unspecified: Secondary | ICD-10-CM | POA: Insufficient documentation

## 2019-09-16 DIAGNOSIS — K640 First degree hemorrhoids: Secondary | ICD-10-CM | POA: Diagnosis not present

## 2019-09-16 DIAGNOSIS — K219 Gastro-esophageal reflux disease without esophagitis: Secondary | ICD-10-CM | POA: Diagnosis present

## 2019-09-16 DIAGNOSIS — D123 Benign neoplasm of transverse colon: Secondary | ICD-10-CM | POA: Insufficient documentation

## 2019-09-16 DIAGNOSIS — I10 Essential (primary) hypertension: Secondary | ICD-10-CM | POA: Insufficient documentation

## 2019-09-16 DIAGNOSIS — Z79899 Other long term (current) drug therapy: Secondary | ICD-10-CM | POA: Insufficient documentation

## 2019-09-16 DIAGNOSIS — K2970 Gastritis, unspecified, without bleeding: Secondary | ICD-10-CM | POA: Diagnosis not present

## 2019-09-16 HISTORY — PX: ESOPHAGOGASTRODUODENOSCOPY (EGD) WITH PROPOFOL: SHX5813

## 2019-09-16 HISTORY — PX: COLONOSCOPY WITH PROPOFOL: SHX5780

## 2019-09-16 SURGERY — ESOPHAGOGASTRODUODENOSCOPY (EGD) WITH PROPOFOL
Anesthesia: General

## 2019-09-16 MED ORDER — SODIUM CHLORIDE 0.9 % IV SOLN: INTRAVENOUS | Status: DC

## 2019-09-16 MED ORDER — PHENYLEPHRINE HCL (PRESSORS) 10 MG/ML IV SOLN
INTRAVENOUS | Status: AC
  Filled 2019-09-16: qty 1

## 2019-09-16 MED ORDER — PROPOFOL 500 MG/50ML IV EMUL
INTRAVENOUS | Status: DC | PRN
  Administered 2019-09-16: 165 ug/kg/min via INTRAVENOUS

## 2019-09-16 MED ORDER — LIDOCAINE HCL (CARDIAC) PF 100 MG/5ML IV SOSY
PREFILLED_SYRINGE | INTRAVENOUS | Status: DC | PRN
  Administered 2019-09-16: 100 mg via INTRAVENOUS

## 2019-09-16 MED ORDER — MIDAZOLAM HCL 2 MG/2ML IJ SOLN
INTRAMUSCULAR | Status: AC
  Filled 2019-09-16: qty 2

## 2019-09-16 MED ORDER — GLYCOPYRROLATE 0.2 MG/ML IJ SOLN
INTRAMUSCULAR | Status: DC | PRN
  Administered 2019-09-16: .2 mg via INTRAVENOUS

## 2019-09-16 MED ORDER — GLYCOPYRROLATE 0.2 MG/ML IJ SOLN
INTRAMUSCULAR | Status: AC
  Filled 2019-09-16: qty 4

## 2019-09-16 MED ORDER — MIDAZOLAM HCL 2 MG/2ML IJ SOLN
INTRAMUSCULAR | Status: DC | PRN
  Administered 2019-09-16: 2 mg via INTRAVENOUS

## 2019-09-16 MED ORDER — PROPOFOL 10 MG/ML IV BOLUS
INTRAVENOUS | Status: DC | PRN
  Administered 2019-09-16: 20 mg via INTRAVENOUS
  Administered 2019-09-16: 50 mg via INTRAVENOUS
  Administered 2019-09-16: 10 mg via INTRAVENOUS

## 2019-09-16 MED ORDER — LIDOCAINE HCL (PF) 2 % IJ SOLN
INTRAMUSCULAR | Status: AC
  Filled 2019-09-16: qty 35

## 2019-09-16 NOTE — Op Note (Signed)
[facility] Patient Name: Christian James Procedure Date: 09/16/2019 8:19 AM MRN: [id number] Account #: [id number] Date of Birth: 11/01/1965 Admit Type: Outpatient Age: 54 Room: [facility] ENDO ROOM 3 Gender: Male Note Status: Finalized Procedure:             Upper GI endoscopy Indications:           Gastro-esophageal reflux disease Providers:             Benay Pike. Pernie Grosso MD, MD Medicines:             Propofol per Anesthesia Complications:         No immediate complications. Procedure:             Pre-Anesthesia Assessment:                        - The risks and benefits of the procedure and the                         sedation options and risks were discussed with the                         patient. All questions were answered and informed                         consent was obtained.                        - Patient identification and proposed procedure were                         verified prior to the procedure by the nurse. The                         procedure was verified in the procedure room.                        - ASA Grade Assessment: III - A patient with severe                         systemic disease.                        - After reviewing the risks and benefits, the patient                         was deemed in satisfactory condition to undergo the                         procedure.                        After obtaining informed consent, the endoscope was                         passed under direct vision. Throughout the procedure,                         the patient's blood pressure, pulse, and oxygen  saturations were monitored continuously. The Endoscope                         was introduced through the mouth, and advanced to the                         third part of duodenum. The upper GI endoscopy was                         accomplished without difficulty. The patient tolerated                          the procedure well. Findings:      The esophagus was normal.      Patchy moderate inflammation characterized by erosions and erythema was       found in the gastric antrum.      The cardia and gastric fundus were normal on retroflexion.      The examined duodenum was normal.      There is no endoscopic evidence of Barrett's esophagus, esophagitis,       inflammation or mucosal abnormalities in the distal esophagus. Impression:            - Normal esophagus.                        - Gastritis.                        - Normal examined duodenum.                        - No specimens collected. Recommendation:        - Proceed with colonoscopy Procedure Code(s):     --- Professional ---                        [phone], Esophagogastroduodenoscopy, flexible,                         transoral; diagnostic, including collection of                         specimen(s) by brushing or washing, when performed                         (separate procedure) Diagnosis Code(s):     --- Professional ---                        K29.70, Gastritis, unspecified, without bleeding                        K21.9, Gastro-esophageal reflux disease without                         esophagitis CPT copyright 2019 American Medical Association. All rights reserved. The codes documented in this report are preliminary and upon coder review may  be revised to meet current compliance requirements. Efrain Sella MD, MD 09/16/2019 8:59:18 AM This report has been signed electronically. Number of Addenda: 0 Note Initiated On: 09/16/2019 8:19 AM Estimated Blood Loss:  Estimated blood loss: none. Estimated blood loss: none.  [facility]

## 2019-09-16 NOTE — Addendum Note (Signed)
Addendum  created 09/16/19 1012 by Kelton Pillar, CRNA   Intraprocedure Event edited

## 2019-09-16 NOTE — Interval H&P Note (Signed)
History and Physical Interval Note:  09/16/2019 8:24 AM  Christian James  has presented today for surgery, with the diagnosis of GERD,PERSONAL HX.OF COLON POLYPS.  The various methods of treatment have been discussed with the patient and family. After consideration of risks, benefits and other options for treatment, the patient has consented to  Procedure(s): ESOPHAGOGASTRODUODENOSCOPY (EGD) WITH PROPOFOL (N/A) COLONOSCOPY WITH PROPOFOL (N/A) as a surgical intervention.  The patient's history has been reviewed, patient examined, no change in status, stable for surgery.  I have reviewed the patient's chart and labs.  Questions were answered to the patient's satisfaction.     [place], [place]

## 2019-09-16 NOTE — Anesthesia Postprocedure Evaluation (Signed)
Anesthesia Post Note  Patient: Christian James  Procedure(s) Performed: ESOPHAGOGASTRODUODENOSCOPY (EGD) WITH PROPOFOL (N/A ) COLONOSCOPY WITH PROPOFOL (N/A )  Patient location during evaluation: PACU Anesthesia Type: General Level of consciousness: awake and alert Pain management: pain level controlled Vital Signs Assessment: post-procedure vital signs reviewed and stable Respiratory status: spontaneous breathing, nonlabored ventilation, respiratory function stable and patient connected to nasal cannula oxygen Cardiovascular status: blood pressure returned to baseline and stable Postop Assessment: no apparent nausea or vomiting Anesthetic complications: no   No complications documented.   Last Vitals:  Vitals:   09/16/19 0937 09/16/19 0944  BP:  107/69  Pulse: 61 (!) 55  Resp: 17 20  Temp:    SpO2: 98% 97%    Last Pain:  Vitals:   09/16/19 0944  TempSrc:   PainSc: 0-No pain                 Molli Barrows

## 2019-09-16 NOTE — Anesthesia Procedure Notes (Signed)
Procedure Name: General with mask airway Performed by: Fletcher-Harrison, Kenyatta Gloeckner, CRNA Pre-anesthesia Checklist: Patient identified, Emergency Drugs available, Suction available and Patient being monitored Patient Re-evaluated:Patient Re-evaluated prior to induction Oxygen Delivery Method: Simple face mask Induction Type: IV induction Placement Confirmation: positive ETCO2 and CO2 detector Dental Injury: Teeth and Oropharynx as per pre-operative assessment        

## 2019-09-16 NOTE — Anesthesia Preprocedure Evaluation (Signed)
Anesthesia Evaluation  Patient identified by MRN, date of birth, ID band Patient awake    Reviewed: Allergy & Precautions, H&P , NPO status , Patient's Chart, lab work & pertinent test results, reviewed documented beta blocker date and time   Airway Mallampati: II   Neck ROM: full    Dental  (+) Poor Dentition   Pulmonary neg pulmonary ROS, sleep apnea and Continuous Positive Airway Pressure Ventilation , former smoker,    Pulmonary exam normal        Cardiovascular Exercise Tolerance: Good hypertension, On Medications negative cardio ROS Normal cardiovascular exam Rhythm:regular Rate:Normal     Neuro/Psych  Headaches, negative neurological ROS  negative psych ROS   GI/Hepatic Neg liver ROS, GERD  Medicated,  Endo/Other  negative endocrine ROS  Renal/GU negative Renal ROS  negative genitourinary   Musculoskeletal   Abdominal   Peds  Hematology  (+) Blood dyscrasia, anemia ,   Anesthesia Other Findings Past Medical History: No date: Hyperlipidemia No date: Hypertension No date: Migraines Past Surgical History: No date: APPENDECTOMY No date: COLON SURGERY BMI    Body Mass Index: 42.50 kg/m     Reproductive/Obstetrics negative OB ROS                             Anesthesia Physical Anesthesia Plan  ASA: III  Anesthesia Plan: General   Post-op Pain Management:    Induction:   PONV Risk Score and Plan:   Airway Management Planned:   Additional Equipment:   Intra-op Plan:   Post-operative Plan:   Informed Consent: I have reviewed the patients History and Physical, chart, labs and discussed the procedure including the risks, benefits and alternatives for the proposed anesthesia with the patient or authorized representative who has indicated his/her understanding and acceptance.     Dental Advisory Given  Plan Discussed with: CRNA  Anesthesia Plan Comments:          Anesthesia Quick Evaluation

## 2019-09-16 NOTE — Interval H&P Note (Signed)
History and Physical Interval Note:  09/16/2019 8:24 AM  Christian James  has presented today for surgery, with the diagnosis of GERD,PERSONAL HX.OF COLON POLYPS.  The various methods of treatment have been discussed with the patient and family. After consideration of risks, benefits and other options for treatment, the patient has consented to  Procedure(s): ESOPHAGOGASTRODUODENOSCOPY (EGD) WITH PROPOFOL (N/A) COLONOSCOPY WITH PROPOFOL (N/A) as a surgical intervention.  The patient's history has been reviewed, patient examined, no change in status, stable for surgery.  I have reviewed the patient's chart and labs.  Questions were answered to the patient's satisfaction.     [place] [place], [place]

## 2019-09-16 NOTE — H&P (Signed)
  Outpatient short stay form Pre-procedure 09/16/2019 8:23 AM Christian James K. Alice Reichert, M.D.  Primary Physician: Emily Filbert, MD  Reason for visit: GERD, personal history of adenomatous colon polyps (2016).  History of present illness: Pleasant 54 year old male presents for Barrett's screening for a long-term history of GERD.Patient denies intractable heartburn, dysphagia, hemetemesis, abdominal pain, nausea or vomiting.                            Patient presents for colonoscopy for a personal hx of colon polyps. The patient denies abdominal pain, abnormal weight loss or rectal bleeding.      Current Facility-Administered Medications:  .  0.9 %  sodium chloride infusion, , Intravenous, Continuous, Dre Gamino, Benay Pike, MD  Medications Prior to Admission  Medication Sig Dispense Refill Last Dose  . ACIDOPHILUS LACTOBACILLUS PO Take by mouth.   Past Week at Unknown time  . furosemide (LASIX) 20 MG tablet TAKE 1 TABLET (20 MG TOTAL) BY MOUTH ONCE DAILY.  3 09/15/2019 at Unknown time  . hydrochlorothiazide (HYDRODIURIL) 25 MG tablet TAKE 1 TABLET (25 MG TOTAL) BY MOUTH ONCE DAILY.  3 09/15/2019 at Unknown time  . omeprazole (PRILOSEC) 20 MG capsule TAKE 1 CAPSULE (20 MG TOTAL) BY MOUTH ONCE DAILY.  3 09/15/2019 at Unknown time  . topiramate (TOPAMAX) 100 MG tablet TAKE 2 TABLETS (200 MG TOTAL) BY MOUTH NIGHTLY.  3 09/15/2019 at Unknown time  . butalbital-acetaminophen-caffeine (FIORICET, ESGIC) 50-325-40 MG tablet TAKE 1 TABLET EVERY 4 HOURS AS NEEDED FOR PAIN     . KLOR-CON 8 MEQ tablet Take 16 mEq by mouth daily.  99   . KLOR-CON M20 20 MEQ tablet TAKE 1 TABLET (20 MEQ TOTAL) BY MOUTH ONCE DAILY.  3   . Krill Oil 1000 MG CAPS Take by mouth.     . promethazine (PHENERGAN) 25 MG tablet Take by mouth.        No Known Allergies   Past Medical History:  Diagnosis Date  . Hyperlipidemia   . Hypertension   . Migraines     Review of systems:  Otherwise negative.    Physical Exam  Gen: Alert,  oriented. Appears stated age.  HEENT: [place]/AT. PERRLA. Lungs: CTA, no wheezes. CV: RR nl S1, S2. Abd: soft, benign, no masses. BS+ Ext: No edema. Pulses 2+    Planned procedures: Proceed with EGD and colonoscopy. The patient understands the nature of the planned procedure, indications, risks, alternatives and potential complications including but not limited to bleeding, infection, perforation, damage to internal organs and possible oversedation/side effects from anesthesia. The patient agrees and gives consent to proceed.  Please refer to procedure notes for findings, recommendations and patient disposition/instructions.     Trip Cavanagh K. Alice Reichert, M.D. Gastroenterology 09/16/2019  8:23 AM

## 2019-09-16 NOTE — Op Note (Signed)
[facility] Gastroenterology Patient Name: Christian James Procedure Date: 09/16/2019 8:18 AM MRN: [id number] Account #: [id number] Date of Birth: 12/29/1965 Admit Type: Outpatient Age: 54 Room: [facility] ENDO ROOM 3 Gender: Male Note Status: Finalized Procedure:             Colonoscopy Indications:           Surveillance: Personal history of adenomatous polyps                         on last colonoscopy > 5 years ago Providers:             Lorie Apley K. Kelyn Koskela MD, MD Medicines:             Propofol per Anesthesia Complications:         No immediate complications. Estimated blood loss:                         Minimal. Procedure:             Pre-Anesthesia Assessment:                        - The risks and benefits of the procedure and the                         sedation options and risks were discussed with the                         patient. All questions were answered and informed                         consent was obtained.                        - Patient identification and proposed procedure were                         verified prior to the procedure by the nurse. The                         procedure was verified in the procedure room.                        - ASA Grade Assessment: II - A patient with mild                         systemic disease.                        - After reviewing the risks and benefits, the patient                         was deemed in satisfactory condition to undergo the                         procedure.                        After obtaining informed consent, the colonoscope was  passed under direct vision. Throughout the procedure,                         the patient's blood pressure, pulse, and oxygen                         saturations were monitored continuously. The                         Colonoscope was introduced through the anus and                         advanced to the the cecum, identified by  appendiceal                         orifice and ileocecal valve. The colonoscopy was                         performed without difficulty. The patient tolerated                         the procedure well. The quality of the bowel                         preparation was good. Findings:      The perianal and digital rectal examinations were normal. Pertinent       negatives include normal sphincter tone and no palpable rectal lesions.      A 12 mm polyp was found in the transverse colon. The polyp was       semi-pedunculated. The polyp was removed with a cold snare. Resection       and retrieval were complete. To prevent bleeding after the polypectomy,       two hemostatic clips were successfully placed (MR conditional). There       was no bleeding at the end of the procedure.      Non-bleeding internal hemorrhoids were found during retroflexion. The       hemorrhoids were Grade I (internal hemorrhoids that do not prolapse).      The exam was otherwise without abnormality. Impression:            - One 12 mm polyp in the transverse colon, removed                         with a cold snare. Resected and retrieved. Clips (MR                         conditional) were placed.                        - Non-bleeding internal hemorrhoids.                        - The examination was otherwise normal. Recommendation:        - Patient has a contact number available for                         emergencies. The signs and symptoms of potential  delayed complications were discussed with the patient.                         Return to normal activities tomorrow. Written                         discharge instructions were provided to the patient.                        - Resume previous diet.                        - Continue present medications.                        - Await pathology results.                        - Repeat colonoscopy date to be determined after                          pending pathology results are reviewed for                         surveillance.                        - Return to GI office PRN.                        - The findings and recommendations were discussed with                         the patient. Procedure Code(s):     --- Professional ---                        [phone], Colonoscopy, flexible; with removal of                         tumor(s), polyp(s), or other lesion(s) by snare                         technique Diagnosis Code(s):     --- Professional ---                        K64.0, First degree hemorrhoids                        K63.5, Polyp of colon                        Z86.010, Personal history of colonic polyps CPT copyright 2019 American Medical Association. All rights reserved. The codes documented in this report are preliminary and upon coder review may  be revised to meet current compliance requirements. Efrain Sella MD, MD 09/16/2019 9:17:20 AM This report has been signed electronically. Number of Addenda: 0 Note Initiated On: 09/16/2019 8:18 AM Scope Withdrawal Time: 0 hours 8 minutes 45 seconds  Total Procedure Duration: 0 hours 10 minutes 30 seconds  Estimated Blood Loss:  Estimated blood loss was minimal.      [facility]

## 2019-09-16 NOTE — Transfer of Care (Signed)
Immediate Anesthesia Transfer of Care Note  Patient: Christian James  Procedure(s) Performed: ESOPHAGOGASTRODUODENOSCOPY (EGD) WITH PROPOFOL (N/A ) COLONOSCOPY WITH PROPOFOL (N/A )  Patient Location: Endoscopy Unit  Anesthesia Type:General  Level of Consciousness: drowsy and responds to stimulation  Airway & Oxygen Therapy: Patient Spontanous Breathing and Patient connected to face mask oxygen  Post-op Assessment: Report given to RN and Post -op Vital signs reviewed and stable  Post vital signs: Reviewed and stable  Last Vitals:  Vitals Value Taken Time  BP    Temp    Pulse 66 09/16/19 0916  Resp 19 09/16/19 0916  SpO2 98 % 09/16/19 0916  Vitals shown include unvalidated device data.  Last Pain:  Vitals:   09/16/19 0816  TempSrc: Temporal  PainSc: 0-No pain         Complications: No complications documented.

## 2019-09-17 ENCOUNTER — Encounter: Payer: Self-pay | Admitting: Internal Medicine

## 2019-09-17 LAB — SURGICAL PATHOLOGY

## 2019-10-14 ENCOUNTER — Other Ambulatory Visit: Payer: Self-pay | Admitting: Internal Medicine

## 2019-12-11 ENCOUNTER — Other Ambulatory Visit: Payer: Self-pay | Admitting: Internal Medicine

## 2020-06-14 ENCOUNTER — Other Ambulatory Visit: Payer: Self-pay

## 2020-06-14 MED FILL — Furosemide Tab 20 MG: ORAL | 90 days supply | Qty: 90 | Fill #0 | Status: AC

## 2020-07-20 ENCOUNTER — Ambulatory Visit
Admission: RE | Admit: 2020-07-20 | Discharge: 2020-07-20 | Disposition: A | Discharge status: Home / Self Care | Payer: 59 | Source: Ambulatory Visit | Attending: Internal Medicine | Admitting: Internal Medicine

## 2020-07-20 ENCOUNTER — Other Ambulatory Visit: Payer: Self-pay | Admitting: Internal Medicine

## 2020-07-20 ENCOUNTER — Other Ambulatory Visit (HOSPITAL_COMMUNITY): Payer: Self-pay | Admitting: Internal Medicine

## 2020-07-20 ENCOUNTER — Other Ambulatory Visit: Payer: Self-pay

## 2020-07-20 DIAGNOSIS — R4189 Other symptoms and signs involving cognitive functions and awareness: Secondary | ICD-10-CM

## 2020-07-20 DIAGNOSIS — R519 Headache, unspecified: Secondary | ICD-10-CM

## 2020-07-20 LAB — POCT I-STAT CREATININE: Creatinine, Ser: 1 mg/dL (ref 0.61–1.24)

## 2020-07-20 MED ORDER — IOHEXOL 350 MG/ML SOLN
75.0000 mL | Freq: Once | INTRAVENOUS | Status: AC | PRN
  Administered 2020-07-20: 75 mL via INTRAVENOUS

## 2020-09-20 ENCOUNTER — Other Ambulatory Visit: Payer: Self-pay

## 2020-09-20 ENCOUNTER — Ambulatory Visit
Admission: EM | Admit: 2020-09-20 | Discharge: 2020-09-20 | Discharge status: Against Medical Advice | Payer: 59 | Attending: Family Medicine | Admitting: Family Medicine

## 2020-09-20 ENCOUNTER — Other Ambulatory Visit: Payer: Self-pay | Admitting: Family Medicine

## 2020-09-20 ENCOUNTER — Other Ambulatory Visit (HOSPITAL_COMMUNITY): Payer: Self-pay | Admitting: Family Medicine

## 2020-09-20 ENCOUNTER — Emergency Department
Admission: EM | Admit: 2020-09-20 | Discharge: 2020-09-20 | Discharge status: Against Medical Advice | Payer: 59 | Source: Home / Self Care

## 2020-09-20 DIAGNOSIS — M25552 Pain in left hip: Secondary | ICD-10-CM

## 2020-09-20 MED ORDER — OXYCODONE HCL 5 MG PO TABS
ORAL_TABLET | ORAL | 0 refills | Status: DC
  Filled 2020-09-20: qty 35, 6d supply, fill #0

## 2020-09-20 MED ORDER — PREDNISONE 10 MG PO TABS
ORAL_TABLET | ORAL | 0 refills | Status: DC
  Filled 2020-09-20: qty 42, 12d supply, fill #0

## 2020-10-20 NOTE — Addendum Note (Signed)
Encounter addended by: Annie Paras on: 10/20/2020 11:53 AM  Actions taken: Letter saved

## 2020-11-16 ENCOUNTER — Other Ambulatory Visit: Payer: Self-pay | Admitting: Orthopedic Surgery

## 2020-11-29 ENCOUNTER — Other Ambulatory Visit: Payer: Self-pay

## 2020-11-29 ENCOUNTER — Encounter
Admission: RE | Admit: 2020-11-29 | Discharge: 2020-11-29 | Disposition: A | Discharge status: Home / Self Care | Payer: 59 | Source: Ambulatory Visit | Attending: Orthopedic Surgery | Admitting: Orthopedic Surgery

## 2020-11-29 DIAGNOSIS — Z01818 Encounter for other preprocedural examination: Secondary | ICD-10-CM | POA: Diagnosis not present

## 2020-11-29 HISTORY — DX: Sleep apnea, unspecified: G47.30

## 2020-11-29 LAB — SURGICAL PCR SCREEN
MRSA, PCR: NEGATIVE
Staphylococcus aureus: NEGATIVE

## 2020-11-29 LAB — TYPE AND SCREEN
ABO/RH(D): O POS
Antibody Screen: NEGATIVE

## 2020-11-29 NOTE — Patient Instructions (Addendum)
Your procedure is scheduled on: Thursday 12/08/20 Report to the Registration Desk on the 1st floor of the [facility]. To find out your arrival time, please call [phone] between 1PM - 3PM on: Wednesday 10/12//22  REMEMBER: Instructions that are not followed completely may result in serious medical risk, up to and including death; or upon the discretion of your surgeon and anesthesiologist your surgery may need to be rescheduled.  Do not eat food after midnight the night before surgery.  No gum chewing, lozengers or hard candies.  You may however, drink CLEAR liquids up to 2 hours before you are scheduled to arrive for your surgery. Do not drink anything within 2 hours of your scheduled arrival time.  Clear liquids include: - water  - apple juice without pulp - gatorade (not RED, PURPLE, OR BLUE) - black coffee or tea (Do NOT add milk or creamers to the coffee or tea) Do NOT drink anything that is not on this list.  In addition, your doctor has ordered for you to drink the provided  Ensure Pre-Surgery Clear Carbohydrate Drink  Gatorade G2 Drinking this carbohydrate drink up to two hours before surgery helps to reduce insulin resistance and improve patient outcomes. Please complete drinking 2 hours prior to scheduled arrival time.  TAKE THESE MEDICATIONS THE MORNING OF SURGERY WITH A SIP OF WATER: butalbital-acetaminophen-caffeine (FIORICET, ESGIC) 50-325-40 MG tablet (if needed) Phenergan (if needed) topiramate (TOPAMAX) 100 MG tablet (if needed)  One week prior to surgery: Stop Anti-inflammatories (NSAIDS) such as Advil, Aleve, Ibuprofen, Motrin, Naproxen, Naprosyn and Aspirin based products such as Excedrin, Goodys Powder, BC Powder. Stop ANY OVER THE COUNTER supplements until after surgery Ascorbic Acid (VITAMIN C PO), Misc Natural Products (SAMBUCUS ELDERBERRY ZINC MT), Probiotic You may however, continue to take Tylenol if needed for pain up until the day of surgery.  No  Alcohol for 24 hours before or after surgery.  No Smoking including e-cigarettes for 24 hours prior to surgery.  No chewable tobacco products for at least 6 hours prior to surgery.  No nicotine patches on the day of surgery.  Do not use any "recreational" drugs for at least a week prior to your surgery.  Please be advised that the combination of cocaine and anesthesia may have negative outcomes, up to and including death. If you test positive for cocaine, your surgery will be cancelled.  On the morning of surgery brush your teeth with toothpaste and water, you may rinse your mouth with mouthwash if you wish. Do not swallow any toothpaste or mouthwash.  Use CHG Soap or wipes as directed on instruction sheet.  Do not wear jewelry, make-up, hairpins, clips or nail polish.  Do not wear lotions, powders, or perfumes.   Do not shave body from the neck down 48 hours prior to surgery just in case you cut yourself which could leave a site for infection.  Also, freshly shaved skin may become irritated if using the CHG soap.  Contact lenses, hearing aids and dentures may not be worn into surgery.  Bring C-Pap to the hospital with you.  Do not bring valuables to the hospital. [facility] is not responsible for any missing/lost belongings or valuables.   Notify your doctor if there is any change in your medical condition (cold, fever, infection).  Wear comfortable clothing (specific to your surgery type) to the hospital.  After surgery, you can help prevent lung complications by doing breathing exercises.  Take deep breaths and cough every 1-2 hours.  Your doctor may order a device called an Incentive Spirometer to help you take deep breaths.  If you are being admitted to the hospital overnight, leave your suitcase in the car. After surgery it may be brought to your room.  If you are being discharged the day of surgery, you will not be allowed to drive home. You will need a responsible adult  (18 years or older) to drive you home and stay with you that night.   If you are taking public transportation, you will need to have a responsible adult (18 years or older) with you. Please confirm with your physician that it is acceptable to use public transportation.   Please call the [facility] at [phone] if you have any questions about these instructions.  Surgery Visitation Policy:  Patients undergoing a surgery or procedure may have one family member or support person with them as long as that person is not COVID-19 positive or experiencing its symptoms.  That person may remain in the waiting area during the procedure and may rotate out with other people.  Inpatient Visitation:    Visiting hours are 7 a.m. to 8 p.m. Up to two visitors ages 16+ are allowed at one time in a patient room. The visitors may rotate out with other people during the day. Visitors must check out when they leave, or other visitors will not be allowed. One designated support person may remain overnight. The visitor must pass COVID-19 screenings, use hand sanitizer when entering and exiting the patient's room and wear a mask at all times, including in the patient's room. Patients must also wear a mask when staff or their visitor are in the room. Masking is required regardless of vaccination status.

## 2020-12-06 ENCOUNTER — Other Ambulatory Visit: Payer: Self-pay

## 2020-12-06 ENCOUNTER — Other Ambulatory Visit
Admission: RE | Admit: 2020-12-06 | Discharge: 2020-12-06 | Disposition: A | Discharge status: Home / Self Care | Payer: 59 | Source: Ambulatory Visit | Attending: Orthopedic Surgery | Admitting: Orthopedic Surgery

## 2020-12-06 DIAGNOSIS — Z20822 Contact with and (suspected) exposure to covid-19: Secondary | ICD-10-CM | POA: Insufficient documentation

## 2020-12-06 DIAGNOSIS — Z01812 Encounter for preprocedural laboratory examination: Secondary | ICD-10-CM | POA: Insufficient documentation

## 2020-12-06 DIAGNOSIS — Z8616 Personal history of COVID-19: Secondary | ICD-10-CM

## 2020-12-06 HISTORY — DX: Personal history of COVID-19: Z86.16

## 2020-12-06 LAB — URINALYSIS, COMPLETE (UACMP) WITH MICROSCOPIC
Bacteria, UA: NONE SEEN
Bilirubin Urine: NEGATIVE
Glucose, UA: NEGATIVE mg/dL
Hgb urine dipstick: NEGATIVE
Ketones, ur: NEGATIVE mg/dL
Leukocytes,Ua: NEGATIVE
Nitrite: NEGATIVE
Protein, ur: NEGATIVE mg/dL
Specific Gravity, Urine: 1.012 (ref 1.005–1.030)
Squamous Epithelial / HPF: NONE SEEN (ref 0–5)
WBC, UA: NONE SEEN WBC/hpf (ref 0–5)
pH: 5 (ref 5.0–8.0)

## 2020-12-07 ENCOUNTER — Encounter: Payer: Self-pay | Admitting: Urgent Care

## 2020-12-07 ENCOUNTER — Other Ambulatory Visit
Admission: RE | Admit: 2020-12-07 | Discharge: 2020-12-07 | Disposition: A | Discharge status: Home / Self Care | Payer: 59 | Source: Ambulatory Visit | Attending: Orthopedic Surgery | Admitting: Orthopedic Surgery

## 2020-12-07 DIAGNOSIS — U071 COVID-19: Secondary | ICD-10-CM | POA: Diagnosis not present

## 2020-12-07 DIAGNOSIS — Z01812 Encounter for preprocedural laboratory examination: Secondary | ICD-10-CM | POA: Diagnosis present

## 2020-12-07 LAB — SARS CORONAVIRUS 2 (TAT 6-24 HRS): SARS Coronavirus 2: POSITIVE — AB

## 2020-12-07 LAB — SARS CORONAVIRUS 2 BY RT PCR (HOSPITAL ORDER, PERFORMED IN ~~LOC~~ HOSPITAL LAB): SARS Coronavirus 2: POSITIVE — AB

## 2020-12-12 ENCOUNTER — Inpatient Hospital Stay
Admission: RE | Admit: 2020-12-12 | Discharge: 2020-12-12 | Disposition: A | Discharge status: Home / Self Care | Payer: 59 | Source: Ambulatory Visit

## 2020-12-12 NOTE — Pre-Procedure Instructions (Signed)
Spoke with Christian James, as his surgery was delayed due to him testing positive for Covid on 12/06/20,  he has completed his pre-admission visit on 11/29/20, with pre-op orders reviewed, this writer called to ensure that medications and/or history has not changed since his appointment on 11/29/20 , patient voices that nothing has changed, that he remains asymptomatic with Covid , he voices that he has all supplies needed to have his surgery. Patient will not require re-testing and his surgery is re-scheduled for 12/20/20.

## 2020-12-16 ENCOUNTER — Other Ambulatory Visit: Payer: 59

## 2020-12-19 MED ORDER — CEFAZOLIN IN SODIUM CHLORIDE 3-0.9 GM/100ML-% IV SOLN
3.0000 g | INTRAVENOUS | Status: AC
  Administered 2020-12-20: 3 g via INTRAVENOUS
  Filled 2020-12-19: qty 100

## 2020-12-19 MED ORDER — CHLORHEXIDINE GLUCONATE 0.12 % MT SOLN: 15.0000 mL | Freq: Once | OROMUCOSAL | Status: AC

## 2020-12-19 MED ORDER — LACTATED RINGERS IV SOLN: INTRAVENOUS | Status: DC

## 2020-12-19 MED ORDER — ORAL CARE MOUTH RINSE: 15.0000 mL | Freq: Once | OROMUCOSAL | Status: AC

## 2020-12-19 MED ORDER — FAMOTIDINE 20 MG PO TABS
20.0000 mg | ORAL_TABLET | Freq: Once | ORAL | Status: AC
  Administered 2020-12-20: 20 mg via ORAL

## 2020-12-20 ENCOUNTER — Ambulatory Visit: Payer: 59 | Admitting: Urgent Care

## 2020-12-20 ENCOUNTER — Encounter
Admission: RE | Disposition: A | Discharge status: Home / Self Care | Payer: Self-pay | Source: Ambulatory Visit | Attending: Orthopedic Surgery

## 2020-12-20 ENCOUNTER — Ambulatory Visit: Payer: 59

## 2020-12-20 ENCOUNTER — Observation Stay: Payer: 59

## 2020-12-20 ENCOUNTER — Observation Stay
Admission: RE | Admit: 2020-12-20 | Discharge: 2020-12-21 | Disposition: A | Discharge status: Home / Self Care | Payer: 59 | Source: Ambulatory Visit | Attending: Orthopedic Surgery | Admitting: Orthopedic Surgery

## 2020-12-20 ENCOUNTER — Other Ambulatory Visit: Payer: Self-pay

## 2020-12-20 ENCOUNTER — Encounter: Payer: Self-pay | Admitting: Orthopedic Surgery

## 2020-12-20 DIAGNOSIS — I10 Essential (primary) hypertension: Secondary | ICD-10-CM | POA: Diagnosis not present

## 2020-12-20 DIAGNOSIS — M1612 Unilateral primary osteoarthritis, left hip: Principal | ICD-10-CM | POA: Insufficient documentation

## 2020-12-20 DIAGNOSIS — Z79899 Other long term (current) drug therapy: Secondary | ICD-10-CM | POA: Diagnosis not present

## 2020-12-20 DIAGNOSIS — Z96642 Presence of left artificial hip joint: Secondary | ICD-10-CM

## 2020-12-20 DIAGNOSIS — Z8616 Personal history of COVID-19: Secondary | ICD-10-CM | POA: Insufficient documentation

## 2020-12-20 DIAGNOSIS — Z419 Encounter for procedure for purposes other than remedying health state, unspecified: Secondary | ICD-10-CM

## 2020-12-20 DIAGNOSIS — G8918 Other acute postprocedural pain: Secondary | ICD-10-CM

## 2020-12-20 HISTORY — PX: TOTAL HIP ARTHROPLASTY: SHX124

## 2020-12-20 LAB — CBC
HCT: 39.3 % (ref 39.0–52.0)
Hemoglobin: 14 g/dL (ref 13.0–17.0)
MCH: 31.4 pg (ref 26.0–34.0)
MCHC: 35.6 g/dL (ref 30.0–36.0)
MCV: 88.1 fL (ref 80.0–100.0)
Platelets: 258 10*3/uL (ref 150–400)
RBC: 4.46 MIL/uL (ref 4.22–5.81)
RDW: 13.2 % (ref 11.5–15.5)
WBC: 17.3 10*3/uL — ABNORMAL HIGH (ref 4.0–10.5)
nRBC: 0 % (ref 0.0–0.2)

## 2020-12-20 LAB — TYPE AND SCREEN
ABO/RH(D): O POS
Antibody Screen: NEGATIVE

## 2020-12-20 LAB — CREATININE, SERUM
Creatinine, Ser: 0.89 mg/dL (ref 0.61–1.24)
GFR, Estimated: >60 mL/min (ref >60)

## 2020-12-20 SURGERY — ARTHROPLASTY, HIP, TOTAL, ANTERIOR APPROACH
Anesthesia: Spinal | Site: Hip

## 2020-12-20 MED ORDER — FAMOTIDINE 20 MG PO TABS
ORAL_TABLET | ORAL | Status: AC
  Filled 2020-12-20: qty 1

## 2020-12-20 MED ORDER — ONDANSETRON HCL 4 MG/2ML IJ SOLN
INTRAMUSCULAR | Status: AC
  Filled 2020-12-20: qty 2

## 2020-12-20 MED ORDER — FENTANYL CITRATE (PF) 100 MCG/2ML IJ SOLN
INTRAMUSCULAR | Status: DC | PRN
  Administered 2020-12-20 (×2): 50 ug via INTRAVENOUS

## 2020-12-20 MED ORDER — BUPIVACAINE-EPINEPHRINE 0.25% -1:200000 IJ SOLN
INTRAMUSCULAR | Status: DC | PRN
  Administered 2020-12-20: 30 mL

## 2020-12-20 MED ORDER — OXYCODONE HCL 5 MG/5ML PO SOLN: 5.0000 mg | Freq: Once | ORAL | Status: AC | PRN

## 2020-12-20 MED ORDER — KETAMINE HCL 10 MG/ML IJ SOLN
INTRAMUSCULAR | Status: DC | PRN
  Administered 2020-12-20: 10 mg via INTRAVENOUS
  Administered 2020-12-20: 20 mg via INTRAVENOUS

## 2020-12-20 MED ORDER — METOCLOPRAMIDE HCL 10 MG PO TABS
5.0000 mg | ORAL_TABLET | Freq: Three times a day (TID) | ORAL | Status: DC | PRN
Start: 2020-12-20 — End: 2020-12-21

## 2020-12-20 MED ORDER — ASCORBIC ACID 500 MG PO TABS
500.0000 mg | ORAL_TABLET | Freq: Every day | ORAL | Status: DC
  Administered 2020-12-21: 500 mg via ORAL
  Filled 2020-12-20: qty 1

## 2020-12-20 MED ORDER — PHENYLEPHRINE HCL-NACL 20-0.9 MG/250ML-% IV SOLN
INTRAVENOUS | Status: DC | PRN
  Administered 2020-12-20: 30 ug/min via INTRAVENOUS
  Administered 2020-12-20: 50 ug/min via INTRAVENOUS

## 2020-12-20 MED ORDER — OXYCODONE HCL 5 MG PO TABS
ORAL_TABLET | ORAL | Status: AC
  Administered 2020-12-20: 10 mg via ORAL
  Filled 2020-12-20: qty 1

## 2020-12-20 MED ORDER — TRAMADOL HCL 50 MG PO TABS: 50.0000 mg | ORAL_TABLET | Freq: Four times a day (QID) | ORAL | Status: DC

## 2020-12-20 MED ORDER — METHOCARBAMOL 1000 MG/10ML IJ SOLN
500.0000 mg | Freq: Four times a day (QID) | INTRAVENOUS | Status: DC | PRN
  Filled 2020-12-20: qty 5

## 2020-12-20 MED ORDER — CHLORHEXIDINE GLUCONATE 0.12 % MT SOLN
OROMUCOSAL | Status: AC
  Administered 2020-12-20: 15 mL via OROMUCOSAL
  Filled 2020-12-20: qty 15

## 2020-12-20 MED ORDER — OXYCODONE HCL 5 MG PO TABS
5.0000 mg | ORAL_TABLET | Freq: Once | ORAL | Status: AC | PRN
  Administered 2020-12-20: 5 mg via ORAL

## 2020-12-20 MED ORDER — BISACODYL 10 MG RE SUPP
10.0000 mg | Freq: Every day | RECTAL | Status: DC | PRN
  Filled 2020-12-20: qty 1

## 2020-12-20 MED ORDER — MIDAZOLAM HCL 2 MG/2ML IJ SOLN
INTRAMUSCULAR | Status: AC
  Filled 2020-12-20: qty 2

## 2020-12-20 MED ORDER — NEOMYCIN-POLYMYXIN B GU 40-200000 IR SOLN
Status: DC | PRN
  Administered 2020-12-20: 4 mL

## 2020-12-20 MED ORDER — HEMOSTATIC AGENTS (NO CHARGE) OPTIME
TOPICAL | Status: DC | PRN
Start: 2020-12-20 — End: 2020-12-20
  Administered 2020-12-20: 2 via TOPICAL

## 2020-12-20 MED ORDER — EPHEDRINE 5 MG/ML INJ
INTRAVENOUS | Status: AC
  Filled 2020-12-20: qty 5

## 2020-12-20 MED ORDER — ZOLPIDEM TARTRATE 5 MG PO TABS: 5.0000 mg | ORAL_TABLET | Freq: Every evening | ORAL | Status: DC | PRN

## 2020-12-20 MED ORDER — POLYETHYLENE GLYCOL 3350 17 G PO PACK
17.0000 g | PACK | Freq: Every day | ORAL | Status: DC | PRN
  Filled 2020-12-20: qty 1

## 2020-12-20 MED ORDER — MIDAZOLAM HCL 5 MG/5ML IJ SOLN
INTRAMUSCULAR | Status: DC | PRN
  Administered 2020-12-20 (×2): 2 mg via INTRAVENOUS

## 2020-12-20 MED ORDER — METHOCARBAMOL 500 MG PO TABS: 500.0000 mg | ORAL_TABLET | Freq: Four times a day (QID) | ORAL | Status: DC | PRN

## 2020-12-20 MED ORDER — KETAMINE HCL 50 MG/5ML IJ SOSY
PREFILLED_SYRINGE | INTRAMUSCULAR | Status: AC
  Filled 2020-12-20: qty 5

## 2020-12-20 MED ORDER — BUPIVACAINE HCL (PF) 0.5 % IJ SOLN
INTRAMUSCULAR | Status: AC
  Filled 2020-12-20: qty 10

## 2020-12-20 MED ORDER — FENTANYL CITRATE (PF) 100 MCG/2ML IJ SOLN: 25.0000 ug | INTRAMUSCULAR | Status: DC | PRN

## 2020-12-20 MED ORDER — DEXMEDETOMIDINE (PRECEDEX) IN NS 20 MCG/5ML (4 MCG/ML) IV SYRINGE
PREFILLED_SYRINGE | INTRAVENOUS | Status: DC | PRN
  Administered 2020-12-20 (×3): 4 ug via INTRAVENOUS

## 2020-12-20 MED ORDER — PROPOFOL 10 MG/ML IV BOLUS
INTRAVENOUS | Status: DC | PRN
  Administered 2020-12-20 (×2): 20 mg via INTRAVENOUS

## 2020-12-20 MED ORDER — VITAMIN B-12 1000 MCG PO TABS
1000.0000 ug | ORAL_TABLET | Freq: Every day | ORAL | Status: DC
  Administered 2020-12-21: 1000 ug via ORAL
  Filled 2020-12-20: qty 1

## 2020-12-20 MED ORDER — FENTANYL CITRATE (PF) 100 MCG/2ML IJ SOLN
INTRAMUSCULAR | Status: AC
  Filled 2020-12-20: qty 2

## 2020-12-20 MED ORDER — GLYCOPYRROLATE 0.2 MG/ML IJ SOLN
INTRAMUSCULAR | Status: DC | PRN
  Administered 2020-12-20: .1 mg via INTRAVENOUS
  Administered 2020-12-20: .2 mg via INTRAVENOUS
  Administered 2020-12-20: .1 mg via INTRAVENOUS

## 2020-12-20 MED ORDER — HYDROMORPHONE HCL 1 MG/ML IJ SOLN
INTRAMUSCULAR | Status: AC
  Administered 2020-12-20: 1 mg via INTRAVENOUS
  Filled 2020-12-20: qty 1

## 2020-12-20 MED ORDER — OXYCODONE HCL 5 MG PO TABS
10.0000 mg | ORAL_TABLET | ORAL | Status: DC | PRN
  Administered 2020-12-21: 15 mg via ORAL

## 2020-12-20 MED ORDER — ONDANSETRON HCL 4 MG PO TABS: 4.0000 mg | ORAL_TABLET | Freq: Four times a day (QID) | ORAL | Status: DC | PRN

## 2020-12-20 MED ORDER — METOCLOPRAMIDE HCL 5 MG/ML IJ SOLN: 5.0000 mg | Freq: Three times a day (TID) | INTRAMUSCULAR | Status: DC | PRN

## 2020-12-20 MED ORDER — HYDROMORPHONE HCL 1 MG/ML IJ SOLN
0.5000 mg | INTRAMUSCULAR | Status: DC | PRN
  Administered 2020-12-20 – 2020-12-21 (×3): 1 mg via INTRAVENOUS

## 2020-12-20 MED ORDER — ACIDOPHILUS LACTOBACILLUS PO CAPS: 1.0000 | ORAL_CAPSULE | Freq: Every day | ORAL | Status: DC

## 2020-12-20 MED ORDER — MAGNESIUM OXIDE 400 MG PO TABS
400.0000 mg | ORAL_TABLET | Freq: Every day | ORAL | Status: DC
  Administered 2020-12-21: 400 mg via ORAL
  Filled 2020-12-20 (×2): qty 1

## 2020-12-20 MED ORDER — HYDROCHLOROTHIAZIDE 25 MG PO TABS
25.0000 mg | ORAL_TABLET | Freq: Every day | ORAL | Status: DC
  Administered 2020-12-21: 25 mg via ORAL
  Filled 2020-12-20 (×2): qty 1

## 2020-12-20 MED ORDER — 0.9 % SODIUM CHLORIDE (POUR BTL) OPTIME
TOPICAL | Status: DC | PRN
  Administered 2020-12-20: 1000 mL

## 2020-12-20 MED ORDER — PROPOFOL 500 MG/50ML IV EMUL
INTRAVENOUS | Status: DC | PRN
  Administered 2020-12-20: 80 ug/kg/min via INTRAVENOUS

## 2020-12-20 MED ORDER — PHENYLEPHRINE HCL (PRESSORS) 10 MG/ML IV SOLN
INTRAVENOUS | Status: DC | PRN
  Administered 2020-12-20 (×3): 100 ug via INTRAVENOUS

## 2020-12-20 MED ORDER — ONDANSETRON HCL 4 MG/2ML IJ SOLN
INTRAMUSCULAR | Status: DC | PRN
  Administered 2020-12-20: 4 mg via INTRAVENOUS

## 2020-12-20 MED ORDER — HYDROMORPHONE HCL 1 MG/ML IJ SOLN
INTRAMUSCULAR | Status: AC
  Filled 2020-12-20: qty 1

## 2020-12-20 MED ORDER — RISAQUAD PO CAPS
1.0000 | ORAL_CAPSULE | Freq: Every day | ORAL | Status: DC
  Administered 2020-12-21: 1 via ORAL
  Filled 2020-12-20: qty 1

## 2020-12-20 MED ORDER — CEFAZOLIN IN SODIUM CHLORIDE 3-0.9 GM/100ML-% IV SOLN
3.0000 g | Freq: Four times a day (QID) | INTRAVENOUS | Status: AC
  Administered 2020-12-20 – 2020-12-21 (×2): 3 g via INTRAVENOUS
  Filled 2020-12-20 (×2): qty 100

## 2020-12-20 MED ORDER — PHENOL 1.4 % MT LIQD
1.0000 | OROMUCOSAL | Status: DC | PRN
  Filled 2020-12-20: qty 177

## 2020-12-20 MED ORDER — TRAMADOL HCL 50 MG PO TABS
ORAL_TABLET | ORAL | Status: AC
  Administered 2020-12-20: 50 mg via ORAL
  Filled 2020-12-20: qty 1

## 2020-12-20 MED ORDER — POTASSIUM CHLORIDE CRYS ER 20 MEQ PO TBCR
EXTENDED_RELEASE_TABLET | ORAL | Status: AC
  Administered 2020-12-20: 20 meq via ORAL
  Filled 2020-12-20: qty 1

## 2020-12-20 MED ORDER — TOPIRAMATE 100 MG PO TABS
100.0000 mg | ORAL_TABLET | Freq: Every day | ORAL | Status: DC
  Administered 2020-12-21: 100 mg via ORAL
  Filled 2020-12-20 (×2): qty 1

## 2020-12-20 MED ORDER — ACETAMINOPHEN 325 MG PO TABS
ORAL_TABLET | ORAL | Status: AC
  Administered 2020-12-20: 650 mg via ORAL
  Filled 2020-12-20: qty 2

## 2020-12-20 MED ORDER — FUROSEMIDE 20 MG PO TABS
20.0000 mg | ORAL_TABLET | Freq: Every day | ORAL | Status: DC
  Administered 2020-12-21: 20 mg via ORAL
  Filled 2020-12-20 (×2): qty 1

## 2020-12-20 MED ORDER — SODIUM CHLORIDE 0.9 % IV SOLN
INTRAVENOUS | Status: DC | PRN
  Administered 2020-12-20: 60 mL

## 2020-12-20 MED ORDER — OXYCODONE HCL 5 MG PO TABS: 5.0000 mg | ORAL_TABLET | ORAL | Status: DC | PRN

## 2020-12-20 MED ORDER — ENOXAPARIN SODIUM 80 MG/0.8ML IJ SOSY
0.5000 mg/kg | PREFILLED_SYRINGE | INTRAMUSCULAR | Status: DC
  Administered 2020-12-21: 80 mg via SUBCUTANEOUS
  Filled 2020-12-20 (×2): qty 0.8

## 2020-12-20 MED ORDER — BUTALBITAL-APAP-CAFFEINE 50-325-40 MG PO TABS
1.0000 | ORAL_TABLET | ORAL | Status: DC | PRN
  Filled 2020-12-20 (×3): qty 1

## 2020-12-20 MED ORDER — LIDOCAINE HCL (PF) 2 % IJ SOLN
INTRAMUSCULAR | Status: AC
  Filled 2020-12-20: qty 5

## 2020-12-20 MED ORDER — POTASSIUM CHLORIDE CRYS ER 20 MEQ PO TBCR: 20.0000 meq | EXTENDED_RELEASE_TABLET | Freq: Two times a day (BID) | ORAL | Status: DC

## 2020-12-20 MED ORDER — BUPIVACAINE HCL (PF) 0.5 % IJ SOLN
INTRAMUSCULAR | Status: DC | PRN
  Administered 2020-12-20: 3 mL

## 2020-12-20 MED ORDER — METHOCARBAMOL 500 MG PO TABS
ORAL_TABLET | ORAL | Status: AC
  Administered 2020-12-20: 500 mg via ORAL
  Filled 2020-12-20: qty 1

## 2020-12-20 MED ORDER — FLEET ENEMA 7-19 GM/118ML RE ENEM: 1.0000 | ENEMA | Freq: Once | RECTAL | Status: DC | PRN

## 2020-12-20 MED ORDER — DOCUSATE SODIUM 100 MG PO CAPS
100.0000 mg | ORAL_CAPSULE | Freq: Two times a day (BID) | ORAL | Status: DC
  Administered 2020-12-20 – 2020-12-21 (×2): 100 mg via ORAL
  Filled 2020-12-20 (×3): qty 1

## 2020-12-20 MED ORDER — PROPOFOL 10 MG/ML IV BOLUS
INTRAVENOUS | Status: AC
  Filled 2020-12-20: qty 20

## 2020-12-20 MED ORDER — DIPHENHYDRAMINE HCL 12.5 MG/5ML PO ELIX
12.5000 mg | ORAL_SOLUTION | ORAL | Status: DC | PRN
  Filled 2020-12-20: qty 10

## 2020-12-20 MED ORDER — PROPOFOL 10 MG/ML IV BOLUS
INTRAVENOUS | Status: AC
  Filled 2020-12-20: qty 60

## 2020-12-20 MED ORDER — MENTHOL 3 MG MT LOZG
1.0000 | LOZENGE | OROMUCOSAL | Status: DC | PRN
  Filled 2020-12-20: qty 9

## 2020-12-20 MED ORDER — SODIUM CHLORIDE 0.9 % IV SOLN: INTRAVENOUS | Status: DC

## 2020-12-20 MED ORDER — ALUM & MAG HYDROXIDE-SIMETH 200-200-20 MG/5ML PO SUSP: 30.0000 mL | ORAL | Status: DC | PRN

## 2020-12-20 MED ORDER — ONDANSETRON HCL 4 MG/2ML IJ SOLN: 4.0000 mg | Freq: Four times a day (QID) | INTRAMUSCULAR | Status: DC | PRN

## 2020-12-20 MED ORDER — OXYCODONE HCL 5 MG PO TABS
ORAL_TABLET | ORAL | Status: AC
  Filled 2020-12-20: qty 2

## 2020-12-20 MED ORDER — EPHEDRINE SULFATE 50 MG/ML IJ SOLN
INTRAMUSCULAR | Status: DC | PRN
  Administered 2020-12-20: 5 mg via INTRAVENOUS
  Administered 2020-12-20: 10 mg via INTRAVENOUS
  Administered 2020-12-20: 5 mg via INTRAVENOUS
  Administered 2020-12-20: 10 mg via INTRAVENOUS

## 2020-12-20 MED ORDER — ACETAMINOPHEN 325 MG PO TABS: 325.0000 mg | ORAL_TABLET | Freq: Four times a day (QID) | ORAL | Status: DC | PRN

## 2020-12-20 SURGICAL SUPPLY — 63 items
BLADE SAGITTAL AGGR TOOTH XLG (BLADE) ×3 IMPLANT
BNDG COHESIVE 6X5 TAN ST LF (GAUZE/BANDAGES/DRESSINGS) ×9 IMPLANT
CANISTER WOUND CARE 500ML ATS (WOUND CARE) ×3 IMPLANT
CHLORAPREP W/TINT 26 (MISCELLANEOUS) ×6 IMPLANT
COVER BACK TABLE REUSABLE LG (DRAPES) ×3 IMPLANT
CUP ACETAB VERSA DBL 28X58 DMI (Orthopedic Implant) ×3 IMPLANT
DRAPE 3/4 80X56 (DRAPES) ×9 IMPLANT
DRAPE C-ARM XRAY 36X54 (DRAPES) ×3 IMPLANT
DRAPE INCISE IOBAN 66X60 STRL (DRAPES) ×3 IMPLANT
DRAPE POUCH INSTRU U-SHP 10X18 (DRAPES) ×3 IMPLANT
DRESSING SURGICEL FIBRLLR 1X2 (HEMOSTASIS) ×4 IMPLANT
DRSG MEPILEX SACRM 8.7X9.8 (GAUZE/BANDAGES/DRESSINGS) ×3 IMPLANT
DRSG OPSITE POSTOP 4X8 (GAUZE/BANDAGES/DRESSINGS) IMPLANT
DRSG SURGICEL FIBRILLAR 1X2 (HEMOSTASIS) ×6
ELECT BLADE 6.5 EXT (BLADE) ×3 IMPLANT
ELECT REM PT RETURN 9FT ADLT (ELECTROSURGICAL) ×3
ELECTRODE REM PT RTRN 9FT ADLT (ELECTROSURGICAL) ×2 IMPLANT
GAUZE 4X4 16PLY ~~LOC~~+RFID DBL (SPONGE) ×3 IMPLANT
GLOVE SURG SYN 9.0  PF PI (GLOVE) ×2
GLOVE SURG SYN 9.0 PF PI (GLOVE) ×4 IMPLANT
GLOVE SURG UNDER POLY LF SZ9 (GLOVE) ×3 IMPLANT
GOWN SRG 2XL LVL 4 RGLN SLV (GOWNS) ×2 IMPLANT
GOWN STRL NON-REIN 2XL LVL4 (GOWNS) ×1
GOWN STRL REUS W/ TWL LRG LVL3 (GOWN DISPOSABLE) ×2 IMPLANT
GOWN STRL REUS W/TWL LRG LVL3 (GOWN DISPOSABLE) ×1
HEAD FEMORAL 28MM SZ S (Head) ×3 IMPLANT
HEMOVAC 400CC 10FR (MISCELLANEOUS) IMPLANT
HOLDER FOLEY CATH W/STRAP (MISCELLANEOUS) ×3 IMPLANT
HOOD FLYTE STAYCOOL (MISCELLANEOUS) ×3 IMPLANT
IRRIGATION SURGIPHOR STRL (IV SOLUTION) IMPLANT
KIT PREVENA INCISION MGT 13 (CANNISTER) ×3 IMPLANT
MANIFOLD NEPTUNE II (INSTRUMENTS) ×3 IMPLANT
MAT ABSORB  FLUID 56X50 GRAY (MISCELLANEOUS) ×1
MAT ABSORB FLUID 56X50 GRAY (MISCELLANEOUS) ×2 IMPLANT
NDL SAFETY ECLIPSE 18X1.5 (NEEDLE) ×2 IMPLANT
NEEDLE HYPO 18GX1.5 SHARP (NEEDLE) ×1
NEEDLE SPNL 20GX3.5 QUINCKE YW (NEEDLE) ×6 IMPLANT
NS IRRIG 1000ML POUR BTL (IV SOLUTION) ×3 IMPLANT
PACK HIP COMPR (MISCELLANEOUS) ×3 IMPLANT
SCALPEL PROTECTED #10 DISP (BLADE) ×6 IMPLANT
SHELL ACETABULAR SZ0 58MM (Shell) ×3 IMPLANT
SOL PREP PVP 2OZ (MISCELLANEOUS)
SOLUTION PREP PVP 2OZ (MISCELLANEOUS) IMPLANT
SPONGE DRAIN TRACH 4X4 STRL 2S (GAUZE/BANDAGES/DRESSINGS) IMPLANT
SPONGE T-LAP 18X18 ~~LOC~~+RFID (SPONGE) ×6 IMPLANT
STAPLER SKIN PROX 35W (STAPLE) ×3 IMPLANT
STEM FEMORAL SZ5 STD COLLARED (Stem) ×3 IMPLANT
STRAP SAFETY 5IN WIDE (MISCELLANEOUS) ×3 IMPLANT
SUT DVC 2 QUILL PDO  T11 36X36 (SUTURE) ×1
SUT DVC 2 QUILL PDO T11 36X36 (SUTURE) ×2 IMPLANT
SUT SILK 0 (SUTURE) ×1
SUT SILK 0 30XBRD TIE 6 (SUTURE) ×2 IMPLANT
SUT V-LOC 90 ABS DVC 3-0 CL (SUTURE) ×3 IMPLANT
SUT VIC AB 1 CT1 36 (SUTURE) ×3 IMPLANT
SYR 20ML LL LF (SYRINGE) ×3 IMPLANT
SYR 30ML LL (SYRINGE) ×3 IMPLANT
SYR 50ML LL SCALE MARK (SYRINGE) ×6 IMPLANT
SYR BULB IRRIG 60ML STRL (SYRINGE) ×3 IMPLANT
TAPE MICROFOAM 4IN (TAPE) ×3 IMPLANT
TOWEL OR 17X26 4PK STRL BLUE (TOWEL DISPOSABLE) ×3 IMPLANT
TRAY FOLEY MTR SLVR 16FR STAT (SET/KITS/TRAYS/PACK) ×3 IMPLANT
WAND WEREWOLF FASTSEAL 6.0 (MISCELLANEOUS) ×3 IMPLANT
WATER STERILE IRR 500ML POUR (IV SOLUTION) ×3 IMPLANT

## 2020-12-20 NOTE — Progress Notes (Signed)
Patient awake/alert x4.  Able to move bil lower ext without event. Able to wiggle, move bil lower ext side to side. Does c/o's discomfort to left thigh area, surgery site: medicated as prescribed.  Patient tolerated dinner 100%  Indwelling foley catheter, patent.  Wife updated, will stay overnight at bedside. Patient and wife verbalize understanding plan of care.

## 2020-12-20 NOTE — Anesthesia Preprocedure Evaluation (Signed)
Anesthesia Evaluation  Patient identified by MRN, date of birth, ID band Patient awake    Reviewed: Allergy & Precautions, NPO status , Patient's Chart, lab work & pertinent test results  History of Anesthesia Complications (+) AWARENESS UNDER ANESTHESIA and history of anesthetic complications  Airway Mallampati: III  TM Distance: <3 FB Neck ROM: full    Dental  (+) Chipped   Pulmonary neg shortness of breath, sleep apnea and Continuous Positive Airway Pressure Ventilation , former smoker,    Pulmonary exam normal        Cardiovascular Exercise Tolerance: Good hypertension, Normal cardiovascular exam     Neuro/Psych  Headaches, negative psych ROS   GI/Hepatic Neg liver ROS, GERD  Medicated and Controlled,  Endo/Other  negative endocrine ROS  Renal/GU      Musculoskeletal   Abdominal   Peds  Hematology negative hematology ROS (+)   Anesthesia Other Findings Past Medical History: 12/06/2020: History of 2019 novel coronavirus disease (COVID-19)     Comment:  a.) (+) PCR on 12/06/2020; patient questioned the               validity of result. Repeat rapid PCR collected on               12/07/2020 - result also (+). No date: Hyperlipidemia No date: Hypertension No date: Migraines No date: Sleep apnea  Past Surgical History: No date: APPENDECTOMY No date: COLON SURGERY 09/16/2019: COLONOSCOPY WITH PROPOFOL; N/A     Comment:  Procedure: COLONOSCOPY WITH PROPOFOL;  Surgeon: Toledo,               Benay Pike, MD;  Location: [facility] ENDOSCOPY;  Service:               Gastroenterology;  Laterality: N/A; 09/16/2019: ESOPHAGOGASTRODUODENOSCOPY (EGD) WITH PROPOFOL; N/A     Comment:  Procedure: ESOPHAGOGASTRODUODENOSCOPY (EGD) WITH               PROPOFOL;  Surgeon: Toledo, Benay Pike, MD;  Location:               [facility] ENDOSCOPY;  Service: Gastroenterology;  Laterality:               N/A; No date: VEIN SURGERY;  Bilateral  BMI    Body Mass Index: 44.89 kg/m      Reproductive/Obstetrics negative OB ROS                             Anesthesia Physical Anesthesia Plan  ASA: 3  Anesthesia Plan: Spinal   Post-op Pain Management:    Induction:   PONV Risk Score and Plan:   Airway Management Planned: Natural Airway and Nasal Cannula  Additional Equipment:   Intra-op Plan:   Post-operative Plan:   Informed Consent: I have reviewed the patients History and Physical, chart, labs and discussed the procedure including the risks, benefits and alternatives for the proposed anesthesia with the patient or authorized representative who has indicated his/her understanding and acceptance.     Dental Advisory Given  Plan Discussed with: Anesthesiologist, CRNA and Surgeon  Anesthesia Plan Comments: (Patient reports no bleeding problems and no anticoagulant use.  Plan for spinal with backup GA  Patient consented for risks of anesthesia including but not limited to:  - adverse reactions to medications - damage to eyes, teeth, lips or other oral mucosa - nerve damage due to positioning  - risk of bleeding, infection and or nerve  damage from spinal that could lead to paralysis - risk of headache or failed spinal - damage to teeth, lips or other oral mucosa - sore throat or hoarseness - damage to heart, brain, nerves, lungs, other parts of body or loss of life  Patient voiced understanding.)        Anesthesia Quick Evaluation

## 2020-12-20 NOTE — Op Note (Signed)
12/20/2020  3:34 PM  PATIENT:  Christian James  55 y.o. male  PRE-OPERATIVE DIAGNOSIS:  Primary osteoarthritis of left hip  M16.12  POST-OPERATIVE DIAGNOSIS:  Primary osteoarthritis of left hip  M16.12  PROCEDURE:  Procedure(s): TOTAL HIP ARTHROPLASTY ANTERIOR APPROACH (Left) APPLICATION OF CELL SAVER (N/A)  SURGEON: Laurene Footman, MD  ASSISTANTS: none  ANESTHESIA:   spinal  EBL:  Total I/O In: 1060 [I.V.:1000; Blood:60] Out: 200 [Urine:200]  BLOOD ADMINISTERED:none  DRAINS:  Incisional wound VAC    LOCAL MEDICATIONS USED:  MARCAINE    and OTHER Exparel  SPECIMEN: Left femoral head  DISPOSITION OF SPECIMEN:  PATHOLOGY  COUNTS:  YES  TOURNIQUET:  * No tourniquets in log *  IMPLANTS: Medacta 5 standard AMIS stem with 56 Mpact TM cup and liner with ceramic S head 28 mm  DICTATION: .Dragon Dictation   The patient was brought to the operating room and after spinal anesthesia was obtained patient  was placed on the operative table with the ipsilateral foot into the Medacta attachment, contralateral leg on a well-padded table. C-arm was brought in and preop template x-ray taken. After prepping and draping in usual sterile fashion appropriate patient identification and timeout procedures were completed. Anterior approach to the hip was obtained and centered over the greater trochanter and TFL muscle. The subcutaneous tissue was incised hemostasis being achieved by electrocautery. TFL fascia was incised and the muscle retracted laterally deep retractor placed. The lateral femoral circumflex vessels were identified and ligated. The anterior capsule was exposed and a capsulotomy performed. The neck was identified and a femoral neck cut carried out with a saw. The head was removed without difficulty and showed  degenerative changes to the femoral head and acetabulum with some areas of more significant wear and pitting throughout. Reaming was carried out to 56 mm and a 56 mm cup trial gave  appropriate tightness to the acetabular component a 56 cup was impacted into position. The leg was then externally rotated and ischiofemoral and pubofemoral releases carried out. The femur was sequentially broached to a size 5, size 5 standard stem with S head trials were placed and the final components chosen. The 5 standard AMIS stem was inserted along with a ceramic S 28 mm head and 56 mm liner. The hip was reduced and was stable the wound was thoroughly irrigated with fibrillar placed along the posterior capsule and medial neck. The deep fascia ws closed using a heavy Quill after infiltration of 30 cc of quarter percent Sensorcaine with epinephrine.3-0 V-loc to close the skin with skin staples. Xeroform 4 x 4's ABDs and tape patient was sent to recovery in stable condition.   PLAN OF CARE: Admit for overnight observation

## 2020-12-20 NOTE — Anesthesia Procedure Notes (Addendum)
Spinal  Patient location during procedure: OR Start time: 12/20/2020 1:20 PM End time: 12/20/2020 1:46 PM Reason for block: surgical anesthesia Staffing Performed: anesthesiologist  Anesthesiologist: Mayur Duman, Precious Haws, MD Resident/CRNA: Myrtie Soman, MD Other anesthesia staff: Emmie Niemann, MD Preanesthetic Checklist Completed: patient identified, IV checked, site marked, risks and benefits discussed, surgical consent, monitors and equipment checked, pre-op evaluation and timeout performed Spinal Block Patient position: sitting Prep: ChloraPrep Patient monitoring: heart rate, continuous pulse ox, blood pressure and cardiac monitor Approach: midline Location: L3-4 Injection technique: single-shot Needle Needle type: Whitacre and Introducer  Needle gauge: 24 G Needle length: 9 cm Assessment Sensory level: T10 Events: CSF return Additional Notes Final attempt by [place] Negative paresthesia. Negative blood return. Positive free-flowing CSF. Expiration date of kit checked and confirmed. Patient tolerated procedure well, without complications.

## 2020-12-20 NOTE — Transfer of Care (Signed)
Immediate Anesthesia Transfer of Care Note  Patient: Christian James  Procedure(s) Performed: TOTAL HIP ARTHROPLASTY ANTERIOR APPROACH (Left: Hip) APPLICATION OF CELL SAVER  Patient Location: PACU  Anesthesia Type:Spinal  Level of Consciousness: awake, alert  and oriented  Airway & Oxygen Therapy: Patient Spontanous Breathing and Patient connected to face mask oxygen  Post-op Assessment: Report given to RN and Post -op Vital signs reviewed and stable  Post vital signs: stable  Last Vitals:  Vitals Value Taken Time  BP 98/69 12/20/20 1535  Temp    Pulse 96 12/20/20 1536  Resp 18 12/20/20 1536  SpO2 98 % 12/20/20 1536  Vitals shown include unvalidated device data.  Last Pain:  Vitals:   12/20/20 1056  TempSrc: Temporal  PainSc: 0-No pain         Complications: No notable events documented.

## 2020-12-20 NOTE — Consult Note (Signed)
PHARMACIST - PHYSICIAN COMMUNICATION  CONCERNING:  Enoxaparin (Lovenox) for DVT Prophylaxis    RECOMMENDATION: Patient was prescribed enoxaprin 40mg  q24 hours for VTE prophylaxis.   Filed Weights   12/20/20 1056 12/20/20 1756  Weight: (!) 158.6 kg (349 lb 10.4 oz) (!) 158 kg (348 lb 5.2 oz)    Body mass index is 47.24 kg/m.  CrCl cannot be calculated (Patient's most recent lab result is older than the maximum 21 days allowed.).   Based on [place] patient is candidate for enoxaparin 0.5mg /kg TBW SQ every 24 hours based on BMI being >30.  DESCRIPTION: Pharmacy has adjusted enoxaparin dose per [facility] policy.  Patient is now receiving enoxaparin 80 mg every 24 hours   Narda Rutherford, PharmD Pharmacy Resident  12/20/2020 6:04 PM

## 2020-12-20 NOTE — H&P (Signed)
Chief Complaint  Patient presents with   Pre-op Exam  Scheduled for THA 12/08/20 by Dr. Rudene Christians    History of the Present Illness: Christian James is a 55 y.o. male here today here for history and physical for left total hip arthroplasty with Dr. Hessie Knows on 12/08/2020. Patient's had x-rays and MRIs of the left hip showing significant degenerative changes along the posterior and central left hip joint. Patient has had cortisone injections into the left hip with some short-term relief. Patient's pain has been present for several months and increasing with weightbearing activity. Pain is located in his groin and thigh. Pain worse with getting in and out of a car and performing any type of activity involving hip flexion. Patient did undergo a diagnostic lidocaine injection which helped significantly for 10 minutes and then his pain returned. At times he is using crutches. History exam and imaging all consistent with advanced left hip osteoarthritis. Patient is seen Dr. Rudene Christians, discussed total hip arthroplasty and agreed and consented to procedure.  The patient has hypertension. He is not diabetic. His primary care physician is Emily Filbert, MD.   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 The patient has been out of work since 09/20/2020.   I have reviewed past medical, surgical, social and family history, and allergies as documented in the EMR.  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   Migraine with aura and without status migrainosus, not intractable 12/11/2013   OSA (obstructive sleep apnea) 03/18/2014   Past Surgical History: Past Surgical History:  Procedure Laterality Date   APPENDECTOMY  80s   COLONOSCOPY 04/09/2014  Adenomatous Polyp: CBF 03/2019   COLONOSCOPY 09/16/2019  Tubular adenoma/PHx CP/Repeat  34yrs/TKT   COLONOSCOPY 09/2019   EGD 09/16/2019  Gastritis/No Repeat/TKT   Past Family History: Family History  Problem Relation Age of Onset   No Known Problems Mother   Colon polyps Father   Medications: Current Outpatient Medications Ordered in Epic  Medication Sig Dispense Refill   cyanocobalamin (VITAMIN B12) 1,000 mcg SL tablet Place 1 lozenge under the tongue once daily   FUROsemide (LASIX) 20 MG tablet Take 1 tablet (20 mg total) by mouth once daily 90 tablet 3   gabapentin (NEURONTIN) 300 MG capsule Take 1 capsule (300 mg total) by mouth 3 (three) times daily 90 capsule 1   hydroCHLOROthiazide (HYDRODIURIL) 25 MG tablet TAKE 1 TABLET DAILY 90 tablet 3   HYDROcodone-acetaminophen (NORCO) 5-325 mg tablet Take 2 tablets by mouth at bedtime 30 tablet 0   LACTOBACILLUS ACIDOPHILUS (PROBIOTIC ORAL) Take by mouth once daily   potassium chloride (KLOR-CON) 20 MEQ ER tablet Take 1 tablet (20 mEq total) by mouth 2 (two) times daily 180 tablet 3   promethazine (PHENERGAN) 25 MG tablet Take 1 tablet (25 mg total) by mouth every 6 (six) hours as needed for Nausea 60 tablet 3   topiramate (TOPAMAX) 100 MG tablet TAKE 2 TABLETS NIGHTLY 180 tablet 3   No current Epic-ordered facility-administered medications on file.   Allergies: No Known Allergies   Body mass index is 45.45 kg/m.  Review of Systems: A comprehensive 14 point ROS was performed, reviewed, and the pertinent orthopaedic findings are documented in the HPI.  Vitals:  12/02/20 0915  BP: 124/88    General Physical Examination:  General:  Well developed, well nourished, no apparent distress, normal affect, normal gait with antalgic gait.  HEENT: Head normocephalic, atraumatic, PERRL.   Abdomen: Soft, non tender, non distended, Bowel sounds present.  Heart: Examination of the heart reveals regular, rate, and rhythm. There is no murmur noted on ascultation. There is a normal apical pulse.  Lungs: Lungs are clear  to auscultation. There is no wheeze, rhonchi, or crackles. There is normal expansion of bilateral chest walls.   Left lower extremity: On exam, right hip internal rotation is 40 degrees, external rotation is 50 degrees without pain. Left hip internal rotation is 30 degrees, external rotation is 25 degrees with pain. No flexion contracture. Antalgic gait. No swelling or edema throughout the left leg.  Radiographs:  Technique: Noncontrast multiplanar, multiecho imaging of the hip was performed, including T1-weighted and fluid sensitive sequences.  Following scanning, data was post-processed at an independent workstation, and 3-D images of the hip were created from the axial VIBE images; these images were sent to PACS for review in conjunction with this study.   Study is less than optimal secondary to patient body habitus and associated decreased signal-to-noise.     FINDINGS:   Acetabular labrum: There is an anterior superior labral tear.   Cartilage:  * There is a prominent cleft between the labrum and the cartilage superiorly and laterally (4:23-25) with the suggestion of associated chondral abnormality slightly anteriorly. * Cartilage loss of the posterior femoral condyle with associated joint space narrowing. * There is also medial joint space narrowing with associated femoral and likely acetabular cartilage loss.   Marrow: Normal; no fracture or femoral head AVN. There is a small subchondral cyst or geode in the anterior inferior femoral head.   Osseous morphology:  * There is neutral acetabular version. * There is loss of the head neck junction offset with proliferative changes on the anterior femoral neck. * There is an alpha angle of approximately 59 degrees. * The 3-D reconstructions confirm the loss of head neck junction offset anterior superiorly. * There is the suggestion of anterior femoral head under coverage on the 3-D images. * There is mild subspinous  proliferative change.   Gluteal tendons and trochanteric bursa: Tendinosis and partial tearing of the gluteus minimus tendon near the trochanteric attachment. The gluteus medius medius tendon is unremarkable. There is mild trochanteric fluid at the greater tuberosity. In addition, there is mild fluid adjacent to the tensor fascia lata.   Other visualized tendons:  * There is mild tendinosis of the hamstring tendon origin. * The iliopsoas tendon is unremarkable.   Soft tissues:  * No ischiofemoral impingement. * Inguinal lymph nodes are incidentally noted.     IMPRESSION: Left hip MRI. 1. Anterior superior labral tear as above. 2. Chondral abnormalities as above consistent with osteoarthritis. 3. Osseous morphology in keeping with femoral acetabular impingement in the correct clinical scenario.  Assessment: ICD-10-CM  1. Primary osteoarthritis of left hip M16.12   Plan: 82. 55 year old male with advanced left hip osteoarthritis. Pain is interfering with quality of life and activities daily living. He has failed conservative treatment. Risks, benefits, complications of a left total hip arthroplasty have been discussed with the patient. Patient has agreed and consented to the procedure with Dr. Hessie Knows on 12/08/2020. Patient will be partial weightbearing 4 weeks postop.   Electronically signed by Feliberto Gottron, [place] at 12/02/2020 9:59 AM EDT  Reviewed  H+P. No changes noted.

## 2020-12-21 ENCOUNTER — Encounter: Payer: Self-pay | Admitting: Orthopedic Surgery

## 2020-12-21 DIAGNOSIS — M1612 Unilateral primary osteoarthritis, left hip: Secondary | ICD-10-CM | POA: Diagnosis not present

## 2020-12-21 LAB — CBC
HCT: 35.9 % — ABNORMAL LOW (ref 39.0–52.0)
Hemoglobin: 12.5 g/dL — ABNORMAL LOW (ref 13.0–17.0)
MCH: 30.3 pg (ref 26.0–34.0)
MCHC: 34.8 g/dL (ref 30.0–36.0)
MCV: 86.9 fL (ref 80.0–100.0)
Platelets: 240 10*3/uL (ref 150–400)
RBC: 4.13 MIL/uL — ABNORMAL LOW (ref 4.22–5.81)
RDW: 13.3 % (ref 11.5–15.5)
WBC: 12.9 10*3/uL — ABNORMAL HIGH (ref 4.0–10.5)
nRBC: 0 % (ref 0.0–0.2)

## 2020-12-21 LAB — BASIC METABOLIC PANEL
Anion gap: 4 — ABNORMAL LOW (ref 5–15)
BUN: 12 mg/dL (ref 6–20)
CO2: 27 mmol/L (ref 22–32)
Calcium: 8.2 mg/dL — ABNORMAL LOW (ref 8.9–10.3)
Chloride: 102 mmol/L (ref 98–111)
Creatinine, Ser: 0.95 mg/dL (ref 0.61–1.24)
GFR, Estimated: >60 mL/min (ref >60)
Glucose, Bld: 136 mg/dL — ABNORMAL HIGH (ref 70–99)
Potassium: 3.3 mmol/L — ABNORMAL LOW (ref 3.5–5.1)
Sodium: 133 mmol/L — ABNORMAL LOW (ref 135–145)

## 2020-12-21 MED ORDER — POLYETHYLENE GLYCOL 3350 17 G PO PACK: 17.0000 g | PACK | Freq: Every day | ORAL | 0 refills | Status: AC | PRN

## 2020-12-21 MED ORDER — OXYCODONE HCL 10 MG PO TABS: 10.0000 mg | ORAL_TABLET | ORAL | 0 refills | Status: AC | PRN

## 2020-12-21 MED ORDER — ENOXAPARIN SODIUM 40 MG/0.4ML IJ SOSY: 40.0000 mg | PREFILLED_SYRINGE | INTRAMUSCULAR | 0 refills | Status: AC

## 2020-12-21 MED ORDER — DOCUSATE SODIUM 100 MG PO CAPS: 100.0000 mg | ORAL_CAPSULE | Freq: Two times a day (BID) | ORAL | 0 refills | Status: AC

## 2020-12-21 MED ORDER — TRAMADOL HCL 50 MG PO TABS
ORAL_TABLET | ORAL | Status: AC
  Administered 2020-12-21: 50 mg via ORAL
  Filled 2020-12-21: qty 1

## 2020-12-21 MED ORDER — METHOCARBAMOL 500 MG PO TABS: 500.0000 mg | ORAL_TABLET | Freq: Four times a day (QID) | ORAL | 0 refills | Status: AC | PRN

## 2020-12-21 MED ORDER — TRAMADOL HCL 50 MG PO TABS: 50.0000 mg | ORAL_TABLET | Freq: Four times a day (QID) | ORAL | 0 refills | Status: AC

## 2020-12-21 MED ORDER — ACETAMINOPHEN 325 MG PO TABS
ORAL_TABLET | ORAL | Status: AC
  Administered 2020-12-21: 650 mg via ORAL
  Filled 2020-12-21: qty 2

## 2020-12-21 MED ORDER — ENOXAPARIN SODIUM 40 MG/0.4ML IJ SOSY
PREFILLED_SYRINGE | INTRAMUSCULAR | Status: AC
  Filled 2020-12-21: qty 0.4

## 2020-12-21 MED ORDER — OXYCODONE HCL 5 MG PO TABS
ORAL_TABLET | ORAL | Status: AC
  Filled 2020-12-21: qty 3

## 2020-12-21 MED ORDER — HYDROMORPHONE HCL 1 MG/ML IJ SOLN
INTRAMUSCULAR | Status: AC
  Administered 2020-12-21: 1 mg via INTRAVENOUS
  Filled 2020-12-21: qty 1

## 2020-12-21 MED ORDER — POTASSIUM CHLORIDE 20 MEQ PO PACK
20.0000 meq | PACK | Freq: Three times a day (TID) | ORAL | Status: DC
  Administered 2020-12-21: 20 meq via ORAL
  Filled 2020-12-21 (×3): qty 1

## 2020-12-21 MED ORDER — METHOCARBAMOL 500 MG PO TABS
ORAL_TABLET | ORAL | Status: AC
  Administered 2020-12-21: 500 mg via ORAL
  Filled 2020-12-21: qty 1

## 2020-12-21 MED ORDER — OXYCODONE HCL 5 MG PO TABS
ORAL_TABLET | ORAL | Status: AC
  Administered 2020-12-21: 15 mg via ORAL
  Filled 2020-12-21: qty 3

## 2020-12-21 MED ORDER — HYDROMORPHONE HCL 1 MG/ML IJ SOLN
INTRAMUSCULAR | Status: AC
  Filled 2020-12-21: qty 1

## 2020-12-21 NOTE — Evaluation (Signed)
Occupational Therapy Evaluation Patient Details Name: Christian James MRN: [id number] DOB: 1965/03/04 Today's Date: 12/21/2020   History of Present Illness 55 y/o male s/p L total hip (anterior approach) 10/26.    Clinical Impression   Christian James was seen for OT evaluation this date, POD#1 from above surgery. Pt was independent in all ADLs prior to surgery, however reports needing occasional assistance from his wife with LB ADL management 2/2 L hip pain. Pt is eager to return to PLOF with less pain and improved safety and independence. Pt currently requires minimal assist for LB dressing and bathing while in seated position due to pain and limited AROM of L hip. Pt instructed in self care skills, falls prevention strategies, home/routines modifications, DME/AE for LB bathing and dressing tasks, and compression stocking mgt strategies. Pt would benefit from additional instruction in self care skills and techniques to help maintain precautions with or without assistive devices to support recall and carryover prior to discharge. Do not anticipate the need for f/u OT services upon hospital DC.       Recommendations for follow up therapy are one component of a multi-disciplinary discharge planning process, led by the attending physician.  Recommendations may be updated based on patient status, additional functional criteria and insurance authorization.   Follow Up Recommendations  No OT follow up    Assistance Recommended at Discharge PRN  Functional Status Assessment  Patient has had a recent decline in their functional status and demonstrates the ability to make significant improvements in function in a reasonable and predictable amount of time.  Equipment Recommendations  [facility])    Recommendations for Other Services       Precautions / Restrictions Precautions Precautions: Anterior Hip Precaution Booklet Issued: No Restrictions Weight Bearing Restrictions: Yes LLE Weight  Bearing: Weight bearing as tolerated      Mobility Bed Mobility Overal bed mobility: Needs Assistance Bed Mobility: Supine to Sit     Supine to sit: Min guard     General bed mobility comments: Deferred. Pt up in recliner at start/end of session.    Transfers Overall transfer level: Modified independent Equipment used: Rolling walker (2 wheels)               General transfer comment: Heavy use of BUE for standing/functional mobility 2/2 pain.      Balance Overall balance assessment: Modified Independent;No apparent balance deficits (not formally assessed)                                         ADL either performed or assessed with clinical judgement   ADL Overall ADL's : Needs assistance/impaired                                       General ADL Comments: SUPERVISION for safety with functional mobility. Cueing for safe use of RW. Pt is functionally limited by increased hip pain and decreased ROM in his LLE. He requries MIN A for LB ADL management. MAX A to don compression stockings. Supportive spouse at bedside verbalizes plan to provide 24/7 assist for pt. Educated on AE/DME to support sfety and functional independence with ADL management.     Vision Patient Visual Report: No change from baseline       Perception     Praxis  Pertinent Vitals/Pain Pain Assessment: 0-10 Pain Score: 10-Worst pain ever Pain Location: L anterior hip and groin Pain Descriptors / Indicators: Grimacing;Guarding;Sore Pain Intervention(s): Limited activity within patient's tolerance;Monitored during session;Patient requesting pain meds-RN notified     Hand Dominance     Extremity/Trunk Assessment Upper Extremity Assessment Upper Extremity Assessment: Overall WFL for tasks assessed   Lower Extremity Assessment Lower Extremity Assessment: Overall WFL for tasks assessed;Defer to PT evaluation (s/p L THA anterior approach. WBAT)   Cervical /  Trunk Assessment Cervical / Trunk Assessment: Normal   Communication Communication Communication: No difficulties   Cognition Arousal/Alertness: Awake/alert Behavior During Therapy: WFL for tasks assessed/performed Overall Cognitive Status: Within Functional Limits for tasks assessed                                       General Comments  Pt with expected post-op pain hesitancy, but willing to participate with all acts and ultimatley showed good POD1 mobility and effort    Exercises Exercises: Total Joint Total Joint Exercises Ankle Circles/Pumps: AROM;10 reps Quad Sets: Strengthening;10 reps Short Arc Quad: AROM;10 reps Heel Slides: AROM;10 reps;Strengthening (with resisted leg ext) Hip ABduction/ADduction: AROM;10 reps;Strengthening Other Exercises Other Exercises: Pt/caregiver educated on falls prevention strategies, safe use of AE/DME for ADL management, compression stocking management strategies, and routines modifications to support safety and functional independence upon hospital DC.   Shoulder Instructions      Home Living Family/patient expects to be discharged to:: Private residence Living Arrangements: Spouse/significant other Available Help at Discharge: Available 24 hours/day;Family Type of Home: House Home Access: Stairs to enter [employer] of Steps: 4 Entrance Stairs-Rails: Right;Left Home Layout: One level     Bathroom Shower/Tub: Walk-in shower;Door   Bathroom Toilet: Handicapped height (toilet riser, no handles.)     Home Equipment: Crutches;Toilet riser          Prior Functioning/Environment Prior Level of Function : Independent/Modified Independent;Driving             Mobility Comments: Pt denies falls history in past 12 months ADLs Comments: Per pt/spouse, pt was having some difficulty with LB dressing 2/2 increased hip pain prior to sx. Otherwise totally independent with ADL/IADL management        OT  Problem List: Decreased coordination;Pain;Decreased range of motion;Decreased safety awareness;Decreased knowledge of use of DME or AE;Impaired balance (sitting and/or standing)      OT Treatment/Interventions: Self-care/ADL training;Therapeutic exercise;Therapeutic activities;DME and/or AE instruction;Patient/family education;Balance training    OT Goals(Current goals can be found in the care plan section) Acute Rehab OT Goals Patient Stated Goal: To go home and take a shower OT Goal Formulation: With patient Time For Goal Achievement: 01/04/21 Potential to Achieve Goals: Good ADL Goals Pt Will Perform Lower Body Dressing: with modified independence;sit to/from stand Pt Will Transfer to Toilet: with modified independence;ambulating;bedside commode Pt Will Perform Toileting - Clothing Manipulation and hygiene: with adaptive equipment;sit to/from stand;with modified independence  OT Frequency: Min 1X/week   Barriers to D/C:            Co-evaluation              AM-PAC OT "6 Clicks" Daily Activity     Outcome Measure Help from another person eating meals?: None Help from another person taking care of personal grooming?: None Help from another person toileting, which includes using toliet, bedpan, or urinal?: A Little Help from another person  bathing (including washing, rinsing, drying)?: A Little Help from another person to put on and taking off regular upper body clothing?: None Help from another person to put on and taking off regular lower body clothing?: A Little 6 Click Score: 21   End of Session Equipment Utilized During Treatment: Gait belt;Rolling walker (2 wheels) Nurse Communication: Patient requests pain meds  Activity Tolerance: Patient tolerated treatment well Patient left: in chair;with call bell/phone within reach;with family/visitor present  OT Visit Diagnosis: Other abnormalities of gait and mobility (R26.89);Pain Pain - Right/Left: Left Pain - part of  body: Hip                Time: 3276-1470 OT Time Calculation (min): 24 min Charges:  OT General Charges $OT Visit: 1 Visit OT Evaluation $OT Eval Low Complexity: 1 Low OT Treatments $Self Care/Home Management : 8-22 mins  Shara Blazing, M.S., OTR/L Ascom: [phone] 12/21/20, 11:59 AM

## 2020-12-21 NOTE — Discharge Summary (Signed)
Physician Discharge Summary  Patient ID: Christian James MRN: [id number] DOB/AGE: 1965/12/04 55 y.o.  Admit date: 12/20/2020 Discharge date: 12/21/2020  Admission Diagnoses:  Status post total hip replacement, left [Z96.642]   Discharge Diagnoses: Patient Active Problem List   Diagnosis Date Noted   Status post total hip replacement, left 12/20/2020   Morbid obesity with BMI of 40.0-44.9, adult ([place]) 12/09/2017   Low serum testosterone level 12/02/2017   Pernicious anemia 05/21/2017   Tubular adenoma 05/21/2017   Varicose veins of both lower extremities with pain 07/02/2016   Chronic venous insufficiency 07/02/2016   Pain in limb 07/02/2016   GERD (gastroesophageal reflux disease) 07/02/2016   OSA (obstructive sleep apnea) 03/18/2014   Bilateral leg edema 12/11/2013   Migraine with aura and without status migrainosus, not intractable 12/11/2013    Past Medical History:  Diagnosis Date   History of 2019 novel coronavirus disease (COVID-19) 12/06/2020   a.) (+) PCR on 12/06/2020; patient questioned the validity of result. Repeat rapid PCR collected on 12/07/2020 - result also (+).   Hyperlipidemia    Hypertension    Migraines    Sleep apnea      Transfusion: none   Consultants (if any):   Discharged Condition: Improved  Hospital Course: Christian James is an 55 y.o. male who was admitted 12/20/2020 with a diagnosis of left hip osteoarthritis and went to the operating room on 12/20/2020 and underwent the above named procedures.    Surgeries: Procedure(s): TOTAL HIP ARTHROPLASTY ANTERIOR APPROACH APPLICATION OF CELL SAVER on 12/20/2020 Patient tolerated the surgery well. Taken to PACU where she was stabilized and then transferred to the orthopedic floor.  Started on Lovenox  q 24  hrs. Foot pumps applied bilaterally at 80 mm. Heels elevated on bed with rolled towels. No evidence of DVT. Negative Homan. Physical therapy started on day #1 for gait training and transfer. OT  started day #1 for ADL and assisted devices.  Patient's foley was d/c on day #1. Patient's IV was d/c on day #1.  On post op day #1 patient was stable and ready for discharge to home with HHPT.  Implants: Medacta 5 standard AMIS stem with 56 Mpact TM cup and liner with ceramic S head 28 mm  He was given perioperative antibiotics:  Anti-infectives (From admission, onward)    Start     Dose/Rate Route Frequency Ordered Stop   12/20/20 2000  ceFAZolin (ANCEF) IVPB 3g/100 mL premix        3 g 200 mL/hr over 30 Minutes Intravenous Every 6 hours 12/20/20 1742 12/21/20 0307   12/20/20 0600  ceFAZolin (ANCEF) IVPB 3g/100 mL premix        3 g 200 mL/hr over 30 Minutes Intravenous On call to O.R. 12/19/20 2317 12/20/20 1338     .  He was given sequential compression devices, early ambulation, and Lovenox for DVT prophylaxis.  He benefited maximally from the hospital stay and there were no complications.    Recent vital signs:  Vitals:   12/21/20 0800 12/21/20 1105  BP: 110/70 126/76  Pulse: 88 (!) 101  Resp: 16 18  Temp: 98.2 F (36.8 C)   SpO2: 98%     Recent laboratory studies:  Lab Results  Component Value Date   HGB 12.5 (L) 12/21/2020   HGB 14.0 12/20/2020   Lab Results  Component Value Date   WBC 12.9 (H) 12/21/2020   PLT 240 12/21/2020   No results found for: INR Lab Results  Component Value  Date   NA 133 (L) 12/21/2020   K 3.3 (L) 12/21/2020   CL 102 12/21/2020   CO2 27 12/21/2020   BUN 12 12/21/2020   CREATININE 0.95 12/21/2020   GLUCOSE 136 (H) 12/21/2020    Discharge Medications:   Allergies as of 12/21/2020   No Known Allergies      Medication List     STOP taking these medications    HYDROcodone-acetaminophen 5-325 MG tablet Commonly known as: NORCO/VICODIN   predniSONE 10 MG tablet Commonly known as: DELTASONE       TAKE these medications    ACIDOPHILUS LACTOBACILLUS PO Take 1 capsule by mouth daily.   bumetanide 0.5 MG  tablet Commonly known as: BUMEX TAKE 1 TABLET BY MOUTH ONCE DAILY AS NEEDED FOR EDEMA   butalbital-acetaminophen-caffeine 50-325-40 MG tablet Commonly known as: FIORICET Take 1 tablet by mouth every 4 (four) hours as needed for headache.   docusate sodium 100 MG capsule Commonly known as: COLACE Take 1 capsule (100 mg total) by mouth 2 (two) times daily.   enoxaparin 40 MG/0.4ML injection Commonly known as: LOVENOX Inject 0.4 mLs (40 mg total) into the skin daily for 14 days.   furosemide 20 MG tablet Commonly known as: LASIX TAKE 1 TABLET (20 MG TOTAL) BY MOUTH ONCE DAILY   hydrochlorothiazide 25 MG tablet Commonly known as: HYDRODIURIL TAKE 1 TABLET (25 MG TOTAL) BY MOUTH ONCE DAILY   Klor-Con M20 20 MEQ tablet Generic drug: potassium chloride SA Take 20 mEq by mouth 2 (two) times daily.   magnesium oxide 400 MG tablet Commonly known as: MAG-OX Take 400 mg by mouth daily.   methocarbamol 500 MG tablet Commonly known as: ROBAXIN Take 1 tablet (500 mg total) by mouth every 6 (six) hours as needed for muscle spasms.   Oxycodone HCl 10 MG Tabs Take 1-1.5 tablets (10-15 mg total) by mouth every 4 (four) hours as needed for severe pain (pain score 7-10). What changed:  medication strength how much to take how to take this when to take this reasons to take this   polyethylene glycol 17 g packet Commonly known as: MIRALAX / GLYCOLAX Take 17 g by mouth daily as needed for mild constipation.   promethazine 25 MG tablet Commonly known as: PHENERGAN Take 25 mg by mouth every 6 (six) hours as needed for vomiting.   SAMBUCUS ELDERBERRY ZINC MT Take 1 tablet by mouth daily.   topiramate 100 MG tablet Commonly known as: TOPAMAX TAKE 2 TABLETS (200 MG) BY MOUTH NIGHTLY What changed:  how much to take how to take this when to take this   traMADol 50 MG tablet Commonly known as: ULTRAM Take 1 tablet (50 mg total) by mouth every 6 (six) hours.   vitamin B-12 1000 MCG  tablet Commonly known as: CYANOCOBALAMIN Take 1,000 mcg by mouth daily.   VITAMIN C PO Take 1 tablet by mouth daily.               Durable Medical Equipment  (From admission, onward)           Start     Ordered   12/20/20 1743  DME Walker rolling  Once       Comments: Bariatric  Question Answer Comment  Walker: With 5 Inch Wheels   Patient needs a walker to treat with the following condition Status post total hip replacement, left      12/20/20 1742   12/20/20 1743  DME 3 n 1  Once  12/20/20 1742   12/20/20 1743  DME Bedside commode  Once       Comments: Bariatric  Question:  Patient needs a bedside commode to treat with the following condition  Answer:  Status post total hip replacement, left   12/20/20 1742            Diagnostic Studies: DG HIP OPERATIVE UNILAT W OR W/O PELVIS LEFT  Result Date: 12/20/2020 CLINICAL DATA:  Left total hip replacement. EXAM: OPERATIVE left HIP (WITH PELVIS IF PERFORMED) for VIEWS TECHNIQUE: Fluoroscopic spot image(s) were submitted for interpretation post-operatively. COMPARISON:  CT scan 09/20/2020 FINDINGS: Intraoperative spot fluoro images obtained during left total hip replacement. Final image shows no evidence for immediate hardware complication. IMPRESSION: Intraoperative assessment during left total hip replacement. Electronically Signed   By: Misty Stanley M.D.   On: 12/20/2020 18:07   DG HIP UNILAT WITH PELVIS 2-3 VIEWS LEFT  Result Date: 12/20/2020 CLINICAL DATA:  Status post left total hip replacement. EXAM: DG HIP (WITH OR WITHOUT PELVIS) 2-3V LEFT COMPARISON:  September 20, 2020. FINDINGS: The left femoral and acetabular components are well situated. Expected postoperative changes are seen in the surrounding soft tissues. IMPRESSION: Status post left total hip arthroplasty. Electronically Signed   By: Marijo Conception M.D.   On: 12/20/2020 16:51    Disposition:      Follow-up Information     Duanne Guess,  PA-C Follow up in 2 week(s).   Specialties: Orthopedic Surgery, Emergency Medicine Contact information: [place] [address] [phone]                  Signed: Feliberto Gottron 12/21/2020, 12:13 PM

## 2020-12-21 NOTE — Anesthesia Postprocedure Evaluation (Signed)
Anesthesia Post Note  Patient: Christian James  Procedure(s) Performed: TOTAL HIP ARTHROPLASTY ANTERIOR APPROACH (Left: Hip) APPLICATION OF CELL SAVER  Patient location during evaluation: Nursing Unit Anesthesia Type: Spinal Level of consciousness: awake Vital Signs Assessment: post-procedure vital signs reviewed and stable Respiratory status: spontaneous breathing Cardiovascular status: stable Postop Assessment: no headache Anesthetic complications: no   No notable events documented.   Last Vitals:  Vitals:   12/20/20 2330 12/21/20 0400  BP: 108/70 112/66  Pulse:  81  Resp: 16 17  Temp: 37.6 C 36.7 C  SpO2: 98% 96%    Last Pain:  Vitals:   12/21/20 0520  TempSrc:   PainSc: 4                  Lerry Liner

## 2020-12-21 NOTE — TOC Initial Note (Addendum)
Transition of Care [facility]) - Initial/Assessment Note    Patient Details  Name: Christian James MRN: [id number] Date of Birth: 1965-11-07  Transition of Care [facility]) CM/SW Contact:    Anselm Pancoast, RN Phone Number: 12/21/2020, 1:11 PM  Clinical Narrative:                 LVMM  for [place], Erbe R (Spouse)  [phone] requesting callback to discuss DCP and DME needs.   Outreached to [place] @ [facility] patient in need of RW and Bari BSC. Confirmed Bari BSC would have to be drop shipped to home or picked up at retail store. Will confirm with spouse which she prefers.   Outreach to [employer] @ [place] for PT services however insurance not in network. Will attempt other agencies for availability and present options to patient and spouse.   Outreach to [place] @ [place] who reports they are unable to accept due to currently being 10-15 days out for PT.         Patient Goals and CMS Choice        Expected Discharge Plan and Services                                                Prior Living Arrangements/Services                       Activities of Daily Living Home Assistive Devices/Equipment: None ADL Screening (condition at time of admission) Patient's cognitive ability adequate to safely complete daily activities?: Yes Is the patient deaf or have difficulty hearing?: No Does the patient have difficulty seeing, even when wearing glasses/contacts?: No Does the patient have difficulty concentrating, remembering, or making decisions?: No Patient able to express need for assistance with ADLs?: Yes Does the patient have difficulty dressing or bathing?: No Independently performs ADLs?: Yes (appropriate for developmental age) Does the patient have difficulty walking or climbing stairs?: No Weakness of Legs: None Weakness of Arms/Hands: None  Permission Sought/Granted                  Emotional Assessment              Admission  diagnosis:  Status post total hip replacement, left [N36.144] Patient Active Problem List   Diagnosis Date Noted   Status post total hip replacement, left 12/20/2020   Morbid obesity with BMI of 40.0-44.9, adult ([place]) 12/09/2017   Low serum testosterone level 12/02/2017   Pernicious anemia 05/21/2017   Tubular adenoma 05/21/2017   Varicose veins of both lower extremities with pain 07/02/2016   Chronic venous insufficiency 07/02/2016   Pain in limb 07/02/2016   GERD (gastroesophageal reflux disease) 07/02/2016   OSA (obstructive sleep apnea) 03/18/2014   Bilateral leg edema 12/11/2013   Migraine with aura and without status migrainosus, not intractable 12/11/2013   PCP:  Rusty Aus, MD Pharmacy:   [id number] [address], [place] - [place] [address] Phone: [phone] Fax: [phone]  [facility], [place] [id number] [place] [address] Phone: [phone] Fax: [phone]  [id number] [place], [place] - [address] [place] [address] Phone: [phone] Fax: [phone]     Social Determinants of  Health (SDOH) Interventions    Readmission Risk Interventions No flowsheet data found.

## 2020-12-21 NOTE — Progress Notes (Signed)
Physical Therapy Treatment Patient Details Name: Christian James MRN: [id number] DOB: 1965/06/04 Today's Date: 12/21/2020   History of Present Illness 55 y/o male s/p L total hip (anterior approach) 10/26.    PT Comments    Despite pain pt continues to show great effort with PT. He was able to ambulate >259ft with fatigue but able to increase speed and confidence.  Pt was able to rise to sitting EOB with setting simulating home and rose to standing from low seat height but w/o assist.  Pt making appropriate POD1 gains, will benefit from continued PT at discharge.  Recommendations for follow up therapy are one component of a multi-disciplinary discharge planning process, led by the attending physician.  Recommendations may be updated based on patient status, additional functional criteria and insurance authorization.  Follow Up Recommendations  Follow physician's recommendations for discharge plan and follow up therapies     Assistance Recommended at Discharge PRN  Equipment Recommendations  Rolling walker (2 wheels);3in1 (PT)    Recommendations for Other Services       Precautions / Restrictions Precautions Precautions: Anterior Hip Restrictions LLE Weight Bearing: Weight bearing as tolerated     Mobility  Bed Mobility Overal bed mobility: Modified Independent Bed Mobility: Supine to Sit     Supine to sit: HOB elevated     General bed mobility comments: Pt able to rise slowly, but w/o assist.  Did not need hand rails, HOB elevated    Transfers Overall transfer level: Modified independent Equipment used: Rolling walker (2 wheels)               General transfer comment: Heavy use of BUE for standing but able to rise from low height w/o assist    Ambulation/Gait Ambulation/Gait assistance: Min guard Gait Distance (Feet): 225 Feet Assistive device: Rolling walker (2 wheels)       General Gait Details: Pt with steadily improving cadence and confidence with  increased distance.  Pt was eager to exceed this AMs effort and did so very well.  Pt reliant on walker/UEs during L stance phase but was able to maitnain consistent walker motion for the final half of the effort.  HR did reach 150s with this prolonged bout of ambulation   Stairs             Wheelchair Mobility    Modified Rankin (Stroke Patients Only)       Balance Overall balance assessment: Modified Independent                                          Cognition Arousal/Alertness: Awake/alert Behavior During Therapy: WFL for tasks assessed/performed Overall Cognitive Status: Within Functional Limits for tasks assessed                                          Exercises Total Joint Exercises Ankle Circles/Pumps: AROM;10 reps Quad Sets: Strengthening;10 reps Heel Slides: 10 reps;Strengthening Hip ABduction/ADduction: AROM;Strengthening;10 reps Long Arc Quad: Strengthening;10 reps Marching in Standing: AROM;AAROM;10 reps;Seated    General Comments        Pertinent Vitals/Pain Pain Assessment: 0-10 Pain Score: 9     Home Living  Prior Function            PT Goals (current goals can now be found in the care plan section) Progress towards PT goals: Progressing toward goals    Frequency    BID      PT Plan Current plan remains appropriate    Co-evaluation              AM-PAC PT "6 Clicks" Mobility   Outcome Measure  Help needed turning from your back to your side while in a flat bed without using bedrails?: None Help needed moving from lying on your back to sitting on the side of a flat bed without using bedrails?: None Help needed moving to and from a bed to a chair (including a wheelchair)?: None Help needed standing up from a chair using your arms (e.g., wheelchair or bedside chair)?: None Help needed to walk in hospital room?: A Little Help needed climbing 3-5 steps  with a railing? : A Little 6 Click Score: 22    End of Session Equipment Utilized During Treatment: Gait belt Activity Tolerance: Patient tolerated treatment well;Patient limited by pain Patient left: in chair;with call bell/phone within reach;with family/visitor present Nurse Communication: Mobility status PT Visit Diagnosis: Muscle weakness (generalized) (M62.81);Difficulty in walking, not elsewhere classified (R26.2);Pain Pain - Right/Left: Left Pain - part of body: Hip     Time: 1224-4975 PT Time Calculation (min) (ACUTE ONLY): 47 min  Charges:  $Gait Training: 8-22 mins $Therapeutic Exercise: 8-22 mins $Therapeutic Activity: 8-22 mins                     Kreg Shropshire, DPT 12/21/2020, 5:00 PM

## 2020-12-21 NOTE — TOC Transition Note (Signed)
Transition of Care [facility]) - CM/SW Discharge Note   Patient Details  Name: Christian James MRN: [id number] Date of Birth: 1965/08/18  [facility]) CM/SW Contact:  Anselm Pancoast, RN Phone Number: 12/21/2020, 4:39 PM   Clinical Narrative:    Confirmed patient arranged for DME via [place] and [place] referral sent to [facility] for PT follow up.   Wife notified.    Final next level of care: Home w Home Health Services Barriers to Discharge: No Barriers Identified   Patient Goals and CMS Choice Patient states their goals for this hospitalization and ongoing recovery are:: return home with wife      Discharge Placement                       Discharge Plan and Services     Post Acute Care Choice: Durable Medical Equipment, Home Health          DME Arranged: 3-N-1, Walker rolling DME Agency: [employer] Date DME Agency Contacted: 12/21/20   Representative spoke with at DME Agency: [place] ([place]) Interventions     Readmission Risk Interventions No flowsheet data found.

## 2020-12-21 NOTE — Progress Notes (Signed)
   Subjective: 1 Day Post-Op Procedure(s) (LRB): TOTAL HIP ARTHROPLASTY ANTERIOR APPROACH (Left) APPLICATION OF CELL SAVER (N/A) Patient reports pain as mild.   Patient is well, and has had no acute complaints or problems Denies any CP, SOB, ABD pain. We will continue therapy today.  Plan is to go Home after hospital stay.  Objective: Vital signs in last 24 hours: Temp:  [97.4 F (36.3 C)-99.7 F (37.6 C)] 98 F (36.7 C) (10/26 0400) Pulse Rate:  [52-99] 81 (10/26 0400) Resp:  [12-17] 17 (10/26 0400) BP: (98-124)/(63-84) 112/66 (10/26 0400) SpO2:  [96 %-99 %] 96 % (10/26 0400) Weight:  [158 kg-158.6 kg] 158 kg (10/25 1756)  Intake/Output from previous day: 10/25 0701 - 10/26 0700 In: 2558.3 [P.O.:425; I.V.:1873.3; Blood:60; IV Piggyback:200] Out: 1325 [Urine:1325] Intake/Output this shift: No intake/output data recorded.  Recent Labs    12/20/20 1750 12/21/20 0248  HGB 14.0 12.5*   Recent Labs    12/20/20 1750 12/21/20 0248  WBC 17.3* 12.9*  RBC 4.46 4.13*  HCT 39.3 35.9*  PLT 258 240   Recent Labs    12/20/20 1750 12/21/20 0248  NA  --  133*  K  --  3.3*  CL  --  102  CO2  --  27  BUN  --  12  CREATININE 0.89 0.95  GLUCOSE  --  136*  CALCIUM  --  8.2*   No results for input(s): LABPT, INR in the last 72 hours.  EXAM General - Patient is Alert, Appropriate, and Oriented Extremity - Neurovascular intact Sensation intact distally Intact pulses distally Dorsiflexion/Plantar flexion intact Dressing - dressing C/D/I and no drainage, prevena intact with out drainage Motor Function - intact, moving foot and toes well on exam.   Past Medical History:  Diagnosis Date   History of 2019 novel coronavirus disease (COVID-19) 12/06/2020   a.) (+) PCR on 12/06/2020; patient questioned the validity of result. Repeat rapid PCR collected on 12/07/2020 - result also (+).   Hyperlipidemia    Hypertension    Migraines    Sleep apnea     Assessment/Plan:   1  Day Post-Op Procedure(s) (LRB): TOTAL HIP ARTHROPLASTY ANTERIOR APPROACH (Left) APPLICATION OF CELL SAVER (N/A) Active Problems:   Status post total hip replacement, left  Estimated body mass index is 47.24 kg/m as calculated from the following:   Height as of this encounter: 6' (1.829 m).   Weight as of this encounter: 158 kg. Advance diet Up with therapy Hypokalemia- 3.3, oral K ordered Labs stable VSS Pain controlled CM to assist with discharge to home with HHPT   DVT Prophylaxis - Lovenox, Foot Pumps, and TED hose Weight-Bearing as tolerated to left leg   T. Rachelle Hora, PA-C [place] 12/21/2020, 8:22 AM

## 2020-12-21 NOTE — TOC Initial Note (Addendum)
Transition of Care [facility]) - Initial/Assessment Note    Patient Details  Name: Christian James MRN: [id number] Date of Birth: [birth year]  Transition of Care [facility]) CM/SW Contact:    Christian Pancoast, RN Phone Number: 12/21/2020, 4:17 PM  Clinical Narrative:                 Spoke with patients spouse regarding [facility] and DME. Spouse reports she has already engaged with [facility] and confirmed RW and will have [facility] delivered tomorrow to the home. Spouse was frustrated due to not having a James room after surgery and being unable to assist her husband during recovery. Wife was instructed to go to [facility] store and pick up [facility] however once she got there it was not available. Christian James was able to confirm [facility] would be delivered tomorrow. Wife with numerous concerns regarding her stay and RN CM provided her with patient experience number to voice her concerns.   Outreached to [facility] @ [facility] for possible PT services. Christian James requested order be faxed to [phone].   Expected Discharge Plan: [facility] to Discharge: No James Identified   Patient Goals and CMS Choice Patient states their goals for this hospitalization and ongoing recovery are:: return home with wife      Expected Discharge Plan and Services Expected Discharge Plan: [place] Choice: Durable Medical Equipment, Home Health Living arrangements for the past 2 months: Single Family Home Expected Discharge Date: 12/21/20               DME Arranged: Christian [facility] rolling DME Agency: [facility] Date DME Agency Contacted: 12/21/20   Representative spoke with at DME Agency: [facility] James            Prior Living Arrangements/Services Living arrangements for the past 2 months: [facility] Lives with:: Spouse Patient language and need for interpreter reviewed:: No Do you feel safe going back to the place where you live?: Yes      Need  for Family Participation in Patient Care: Yes (Comment) Care giver support system in place?: Yes (comment) Current home services: DME, Home PT Criminal Activity/Legal Involvement Pertinent to Current Situation/Hospitalization: No - Comment as needed  Activities of Daily Living Home Assistive Devices/Equipment: None ADL Screening (condition at time of admission) Patient's cognitive ability adequate to safely complete daily activities?: Yes Is the patient deaf or have difficulty hearing?: No Does the patient have difficulty seeing, even when wearing glasses/contacts?: No Does the patient have difficulty concentrating, remembering, or making decisions?: No Patient able to express need for assistance with ADLs?: Yes Does the patient have difficulty dressing or bathing?: No Independently performs ADLs?: Yes (appropriate for developmental age) Does the patient have difficulty walking or climbing stairs?: No Weakness of Legs: None Weakness of Arms/Hands: None  Permission Sought/Granted   Permission granted to share information with : Yes, Verbal Permission Granted              Emotional Assessment Appearance:: Appears stated age Attitude/Demeanor/Rapport: Engaged Affect (typically observed): Accepting Orientation: : Oriented to Self, Oriented to Place, Oriented to  Time, Oriented to Situation Alcohol / Substance Use: Not Applicable Psych Involvement: No (comment)  Admission diagnosis:  Status post total hip replacement, left [G66.599] Patient Active Problem List   Diagnosis Date Noted   Status post total hip replacement, left 12/20/2020   Morbid obesity with BMI of 40.0-44.9, adult ([facility])  12/09/2017   Low serum testosterone level 12/02/2017   Pernicious anemia 05/21/2017   Tubular adenoma 05/21/2017   Varicose veins of both lower extremities with pain 07/02/2016   Chronic venous insufficiency 07/02/2016   Pain in limb 07/02/2016   GERD (gastroesophageal reflux disease)  07/02/2016   OSA (obstructive sleep apnea) 03/18/2014   Bilateral leg edema 12/11/2013   Migraine with aura and without status migrainosus, not intractable 12/11/2013   PCP:  Christian Aus, MD Pharmacy:   [id number] [place], [place] [address] Phone: [phone] Fax: [place], [place] [facility] [id number] [place] [address] Phone: [phone] Fax: [phone]  [id number] - [place], [place] - [address] [place] [address] Phone: [phone] Fax: [phone]     Social Determinants of Health (SDOH) Interventions    Readmission Risk Interventions No flowsheet data found.

## 2020-12-21 NOTE — Progress Notes (Signed)
Physical Therapy Evaluation Patient Details Name: Christian James MRN: [id number] DOB: [birth year] Today's Date: 12/21/2020  History of Present Illness  55 y/o male s/p L total hip (anterior approach) 10/26.  Clinical Impression  Pt medicated before PT evaluation, continues to have significant pain t/o session but able to participate well and showed good motivation t/o the treatment.  Pt with some initial hesitancy with exercises but was able to increase effort/show increased tolerance with increased reps.  He was slow and labored with getting up to sitting EOB, but did so w/o direct assist and again with great effort was able to from from low bed height w/o any physical assist.  Pt initially with slow, guarded gait but was able to progress to continuous walker movement and showed decreased reliance on the walker/UEs with increased distance (70 + 60 ft).  Pt also able negotiate up/down 4 steps w/o assist using single L rail. Pt will need continued PT but did well with initial PT/mobility POD1.    Recommendations for follow up therapy are one component of a multi-disciplinary discharge planning process, led by the attending physician.  Recommendations may be updated based on patient status, additional functional criteria and insurance authorization.  Follow Up Recommendations Follow physician's recommendations for discharge plan and follow up therapies    Assistance Recommended at Discharge PRN  Functional Status Assessment Patient has had a recent decline in their functional status and demonstrates the ability to make significant improvements in function in a reasonable and predictable amount of time.  Equipment Recommendations  Rolling walker (2 wheels) (347 lbs)    Recommendations for Other Services       Precautions / Restrictions Precautions Precautions: Anterior Hip Precaution Booklet Issued: Yes (comment) (HEP) Restrictions Weight Bearing Restrictions: Yes LLE Weight Bearing: Weight  bearing as tolerated      Mobility  Bed Mobility Overal bed mobility: Needs Assistance Bed Mobility: Supine to Sit     Supine to sit: Min guard     General bed mobility comments: Pt with slow, labored effort getting L LE to EOB and then getting trunk to upright but did not need any direct assist    Transfers Overall transfer level: Modified independent Equipment used: Rolling walker (2 wheels)               General transfer comment: heavy UE use, plenty of cuing for set up and sequencing.  Able to rise w/o any phyiscal assist multiple times from multiple surfaces    Ambulation/Gait Ambulation/Gait assistance: Min guard Gait Distance (Feet): 70 Feet Assistive device: Rolling walker (2 wheels)       General Gait Details: 70 ft, then after rest break and steps an additional 60 ft. Pt with progressively improving cadence/weight acceptance with increased distance, though still reliant on UEs/walker t/o the effort.  Stairs Stairs: Yes Stairs assistance: Supervision Stair Management: One rail Left;Sideways Number of Stairs: 4 General stair comments: Pt clearly reliant on the rails/UEs but able to negotiate up/down 4 steps w/o assist and only minimal cuing.  Wheelchair Mobility    Modified Rankin (Stroke Patients Only)       Balance Overall balance assessment: Modified Independent                                           Pertinent Vitals/Pain Pain Assessment: 0-10 Pain Score: 9  Pain Location: L anterior hip and groin  Home Living Family/patient expects to be discharged to:: Private residence Living Arrangements: Spouse/significant other Available Help at Discharge: Available 24 hours/day (wife and then other family will privide 24/7 assist for ~1 week) Type of Home: House Home Access: Stairs to enter Entrance Stairs-Rails: Right;Left (wide) Entrance Stairs-Number of Steps: 4     Home Equipment: Crutches      Prior Function Prior  Level of Function : Independent/Modified Independent                     Hand Dominance        Extremity/Trunk Assessment   Upper Extremity Assessment Upper Extremity Assessment: Overall WFL for tasks assessed    Lower Extremity Assessment Lower Extremity Assessment: Overall WFL for tasks assessed (expected L hip post-op pain/strength limitations.)       Communication   Communication: No difficulties  Cognition Arousal/Alertness: Awake/alert Behavior During Therapy: WFL for tasks assessed/performed Overall Cognitive Status: Within Functional Limits for tasks assessed                                          General Comments General comments (skin integrity, edema, etc.): Pt with expected post-op pain hesitancy, but willing to participate with all acts and ultimatley showed good POD1 mobility and effort    Exercises Total Joint Exercises Ankle Circles/Pumps: AROM;10 reps Quad Sets: Strengthening;10 reps Short Arc Quad: AROM;10 reps Heel Slides: AROM;10 reps;Strengthening (with resisted leg ext) Hip ABduction/ADduction: AROM;10 reps;Strengthening   Assessment/Plan    PT Assessment Patient needs continued PT services  PT Problem List Decreased strength;Decreased range of motion;Decreased activity tolerance;Decreased balance;Decreased mobility;Decreased knowledge of use of DME;Decreased safety awareness;Decreased knowledge of precautions;Pain       PT Treatment Interventions DME instruction;Gait training;Stair training;Functional mobility training;Therapeutic activities;Therapeutic exercise;Balance training;Patient/family education    PT Goals (Current goals can be found in the Care Plan section)  Acute Rehab PT Goals Patient Stated Goal: control the pain PT Goal Formulation: With patient Time For Goal Achievement: 01/04/21 Potential to Achieve Goals: Good    Frequency BID   Barriers to discharge        Co-evaluation                AM-PAC PT "6 Clicks" Mobility  Outcome Measure Help needed turning from your back to your side while in a flat bed without using bedrails?: None Help needed moving from lying on your back to sitting on the side of a flat bed without using bedrails?: None Help needed moving to and from a bed to a chair (including a wheelchair)?: None Help needed standing up from a chair using your arms (e.g., wheelchair or bedside chair)?: None Help needed to walk in hospital room?: A Little Help needed climbing 3-5 steps with a railing? : A Little 6 Click Score: 22    End of Session Equipment Utilized During Treatment: Gait belt Activity Tolerance: Patient tolerated treatment well;Patient limited by pain Patient left: in chair;with call bell/phone within reach;with family/visitor present Nurse Communication: Mobility status PT Visit Diagnosis: Muscle weakness (generalized) (M62.81);Difficulty in walking, not elsewhere classified (R26.2);Pain Pain - Right/Left: Left Pain - part of body: Hip    Time: 7628-3151 PT Time Calculation (min) (ACUTE ONLY): 57 min   Charges:   PT Evaluation $PT Eval Low Complexity: 1 Low PT Treatments $Gait Training: 8-22 mins $Therapeutic Exercise: 8-22 mins $Therapeutic Activity: 8-22 mins  Kreg Shropshire, DPT 12/21/2020, 10:50 AM

## 2020-12-21 NOTE — Discharge Instructions (Signed)

## 2020-12-22 LAB — SURGICAL PATHOLOGY

## 2021-04-24 ENCOUNTER — Other Ambulatory Visit: Payer: Self-pay

## 2021-12-13 ENCOUNTER — Other Ambulatory Visit: Payer: Self-pay | Admitting: Physical Medicine and Rehabilitation

## 2021-12-13 DIAGNOSIS — M5416 Radiculopathy, lumbar region: Secondary | ICD-10-CM

## 2021-12-26 ENCOUNTER — Other Ambulatory Visit: Payer: Self-pay | Admitting: Physical Medicine and Rehabilitation

## 2021-12-26 ENCOUNTER — Ambulatory Visit
Admission: RE | Admit: 2021-12-26 | Discharge: 2021-12-26 | Disposition: A | Discharge status: Home / Self Care | Payer: 59 | Source: Ambulatory Visit | Attending: Physical Medicine and Rehabilitation | Admitting: Physical Medicine and Rehabilitation

## 2021-12-26 DIAGNOSIS — M5416 Radiculopathy, lumbar region: Secondary | ICD-10-CM

## 2021-12-26 MED ORDER — IOPAMIDOL (ISOVUE-M 200) INJECTION 41%
1.0000 mL | Freq: Once | INTRAMUSCULAR | Status: AC
  Administered 2021-12-26: 1 mL via EPIDURAL

## 2021-12-26 MED ORDER — METHYLPREDNISOLONE ACETATE 40 MG/ML INJ SUSP (RADIOLOG
80.0000 mg | Freq: Once | INTRAMUSCULAR | Status: AC
  Administered 2021-12-26: 80 mg via EPIDURAL

## 2021-12-26 NOTE — Discharge Instructions (Signed)
Post Procedure Spinal Discharge Instruction Sheet  You may resume a regular diet and any medications that you routinely take (including pain medications) unless otherwise noted by MD.  No driving day of procedure.  Light activity throughout the rest of the day.  Do not do any strenuous work, exercise, bending or lifting.  The day following the procedure, you can resume normal physical activity but you should refrain from exercising or physical therapy for at least three days thereafter.  You may apply ice to the injection site, 20 minutes on, 20 minutes off, as needed. Do not apply ice directly to skin.    Common Side Effects:  Headaches- take your usual medications as directed by your physician.  Increase your fluid intake.  Caffeinated beverages may be helpful.  Lie flat in bed until your headache resolves.  Restlessness or inability to sleep- you may have trouble sleeping for the next few days.  Ask your referring physician if you need any medication for sleep.  Facial flushing or redness- should subside within a few days.  Increased pain- a temporary increase in pain a day or two following your procedure is not unusual.  Take your pain medication as prescribed by your referring physician.  Leg cramps  Please contact our office at [phone] for the following symptoms: Fever greater than 100 degrees. Headaches unresolved with medication after 2-3 days. Increased swelling, pain, or redness at injection site.   Thank you for visiting [facility] today.     YOU MAY RESUME ASPIRIN TODAY.

## 2022-01-25 ENCOUNTER — Other Ambulatory Visit: Payer: Self-pay | Admitting: Internal Medicine

## 2022-01-25 DIAGNOSIS — N63 Unspecified lump in unspecified breast: Secondary | ICD-10-CM

## 2022-02-07 ENCOUNTER — Ambulatory Visit
Admission: RE | Admit: 2022-02-07 | Discharge: 2022-02-07 | Disposition: A | Discharge status: Home / Self Care | Payer: 59 | Source: Ambulatory Visit | Attending: Internal Medicine | Admitting: Internal Medicine

## 2022-02-07 DIAGNOSIS — N63 Unspecified lump in unspecified breast: Secondary | ICD-10-CM

## 2022-02-12 ENCOUNTER — Emergency Department
Admission: EM | Admit: 2022-02-12 | Discharge: 2022-02-12 | Discharge status: Against Medical Advice | Payer: 59 | Attending: Family Medicine | Admitting: Family Medicine

## 2022-02-12 DIAGNOSIS — R109 Unspecified abdominal pain: Secondary | ICD-10-CM | POA: Diagnosis not present

## 2022-02-12 DIAGNOSIS — Z5321 Procedure and treatment not carried out due to patient leaving prior to being seen by health care provider: Secondary | ICD-10-CM | POA: Insufficient documentation

## 2022-02-12 DIAGNOSIS — R1013 Epigastric pain: Secondary | ICD-10-CM | POA: Insufficient documentation

## 2022-02-12 DIAGNOSIS — R112 Nausea with vomiting, unspecified: Secondary | ICD-10-CM | POA: Insufficient documentation

## 2022-02-12 LAB — COMPREHENSIVE METABOLIC PANEL
ALT: 28 U/L (ref 0–44)
AST: 25 U/L (ref 15–41)
Albumin: 4.2 g/dL (ref 3.5–5.0)
Alkaline Phosphatase: 82 U/L (ref 38–126)
Anion gap: 8 (ref 5–15)
BUN: 16 mg/dL (ref 6–20)
CO2: 28 mmol/L (ref 22–32)
Calcium: 9.7 mg/dL (ref 8.9–10.3)
Chloride: 102 mmol/L (ref 98–111)
Creatinine, Ser: 0.91 mg/dL (ref 0.61–1.24)
GFR, Estimated: >60 mL/min (ref >60)
Glucose, Bld: 112 mg/dL — ABNORMAL HIGH (ref 70–99)
Potassium: 3.9 mmol/L (ref 3.5–5.1)
Sodium: 138 mmol/L (ref 135–145)
Total Bilirubin: 0.8 mg/dL (ref 0.3–1.2)
Total Protein: 7.8 g/dL (ref 6.5–8.1)

## 2022-02-12 LAB — CBC
HCT: 41.3 % (ref 39.0–52.0)
Hemoglobin: 13.9 g/dL (ref 13.0–17.0)
MCH: 30.2 pg (ref 26.0–34.0)
MCHC: 33.7 g/dL (ref 30.0–36.0)
MCV: 89.8 fL (ref 80.0–100.0)
Platelets: 307 10*3/uL (ref 150–400)
RBC: 4.6 MIL/uL (ref 4.22–5.81)
RDW: 12.9 % (ref 11.5–15.5)
WBC: 14.7 10*3/uL — ABNORMAL HIGH (ref 4.0–10.5)
nRBC: 0 % (ref 0.0–0.2)

## 2022-02-12 LAB — LIPASE, BLOOD: Lipase: 31 U/L (ref 11–51)

## 2022-02-12 NOTE — ED Triage Notes (Signed)
Pt sts that he has been having really bad abd pain with nausea and vomiting since last night.

## 2022-02-12 NOTE — ED Triage Notes (Signed)
First Nurse note: Pt here from [facility] clinic with ABD pain and vomiting.   HR:65 143/85 98%  97.7 temp

## 2022-02-12 NOTE — ED Provider Triage Note (Signed)
Emergency Medicine Provider Triage Evaluation Note  Christian James , a 56 y.o. male  was evaluated in triage.  Pt complains of abdominal pain with nausea and vomiting that started last night.  Also having some epigastric pain.  Sent from [place] clinic for further evaluation.Marland Kitchen   Physical Exam  BP (!) 148/95 (BP Location: Right Arm)   Pulse (!) 57   Temp 98.2 F (36.8 C) (Oral)   Resp 19   Wt (!) 178.7 kg   SpO2 98%   BMI 53.44 kg/m  Gen:   Awake, no distress   Resp:  Normal effort  MSK:   Moves extremities without difficulty  Other:    Medical Decision Making  Medically screening exam initiated at 2:01 PM.  Appropriate orders placed.  Christian James was informed that the remainder of the evaluation will be completed by another provider, this initial triage assessment does not replace that evaluation, and the importance of remaining in the ED until their evaluation is complete.  Abdominal pain protocol started.   Victorino Dike, FNP 02/12/22 1912

## 2022-04-26 ENCOUNTER — Other Ambulatory Visit: Payer: Self-pay | Admitting: Family Medicine

## 2022-04-26 DIAGNOSIS — M5416 Radiculopathy, lumbar region: Secondary | ICD-10-CM

## 2022-05-02 ENCOUNTER — Ambulatory Visit
Admission: RE | Admit: 2022-05-02 | Discharge: 2022-05-02 | Disposition: A | Discharge status: Home / Self Care | Payer: 59 | Source: Ambulatory Visit | Attending: Family Medicine | Admitting: Family Medicine

## 2022-05-02 DIAGNOSIS — M5416 Radiculopathy, lumbar region: Secondary | ICD-10-CM

## 2022-05-02 MED ORDER — IOPAMIDOL (ISOVUE-M 200) INJECTION 41%
1.0000 mL | Freq: Once | INTRAMUSCULAR | Status: AC
  Administered 2022-05-02: 1 mL via EPIDURAL

## 2022-05-02 MED ORDER — METHYLPREDNISOLONE ACETATE 40 MG/ML INJ SUSP (RADIOLOG
80.0000 mg | Freq: Once | INTRAMUSCULAR | Status: AC
  Administered 2022-05-02: 80 mg via EPIDURAL

## 2022-05-02 NOTE — Discharge Instructions (Signed)

## 2023-08-26 IMAGING — DX DG HIP (WITH OR WITHOUT PELVIS) 2-3V*L*
2 series · 2 of 2 positions shown · non-contrast
Comparison: September 20, 2020.

CLINICAL DATA: Status post left total hip replacement.

EXAM:
DG HIP (WITH OR WITHOUT PELVIS) 2-3V LEFT

[hip ap]
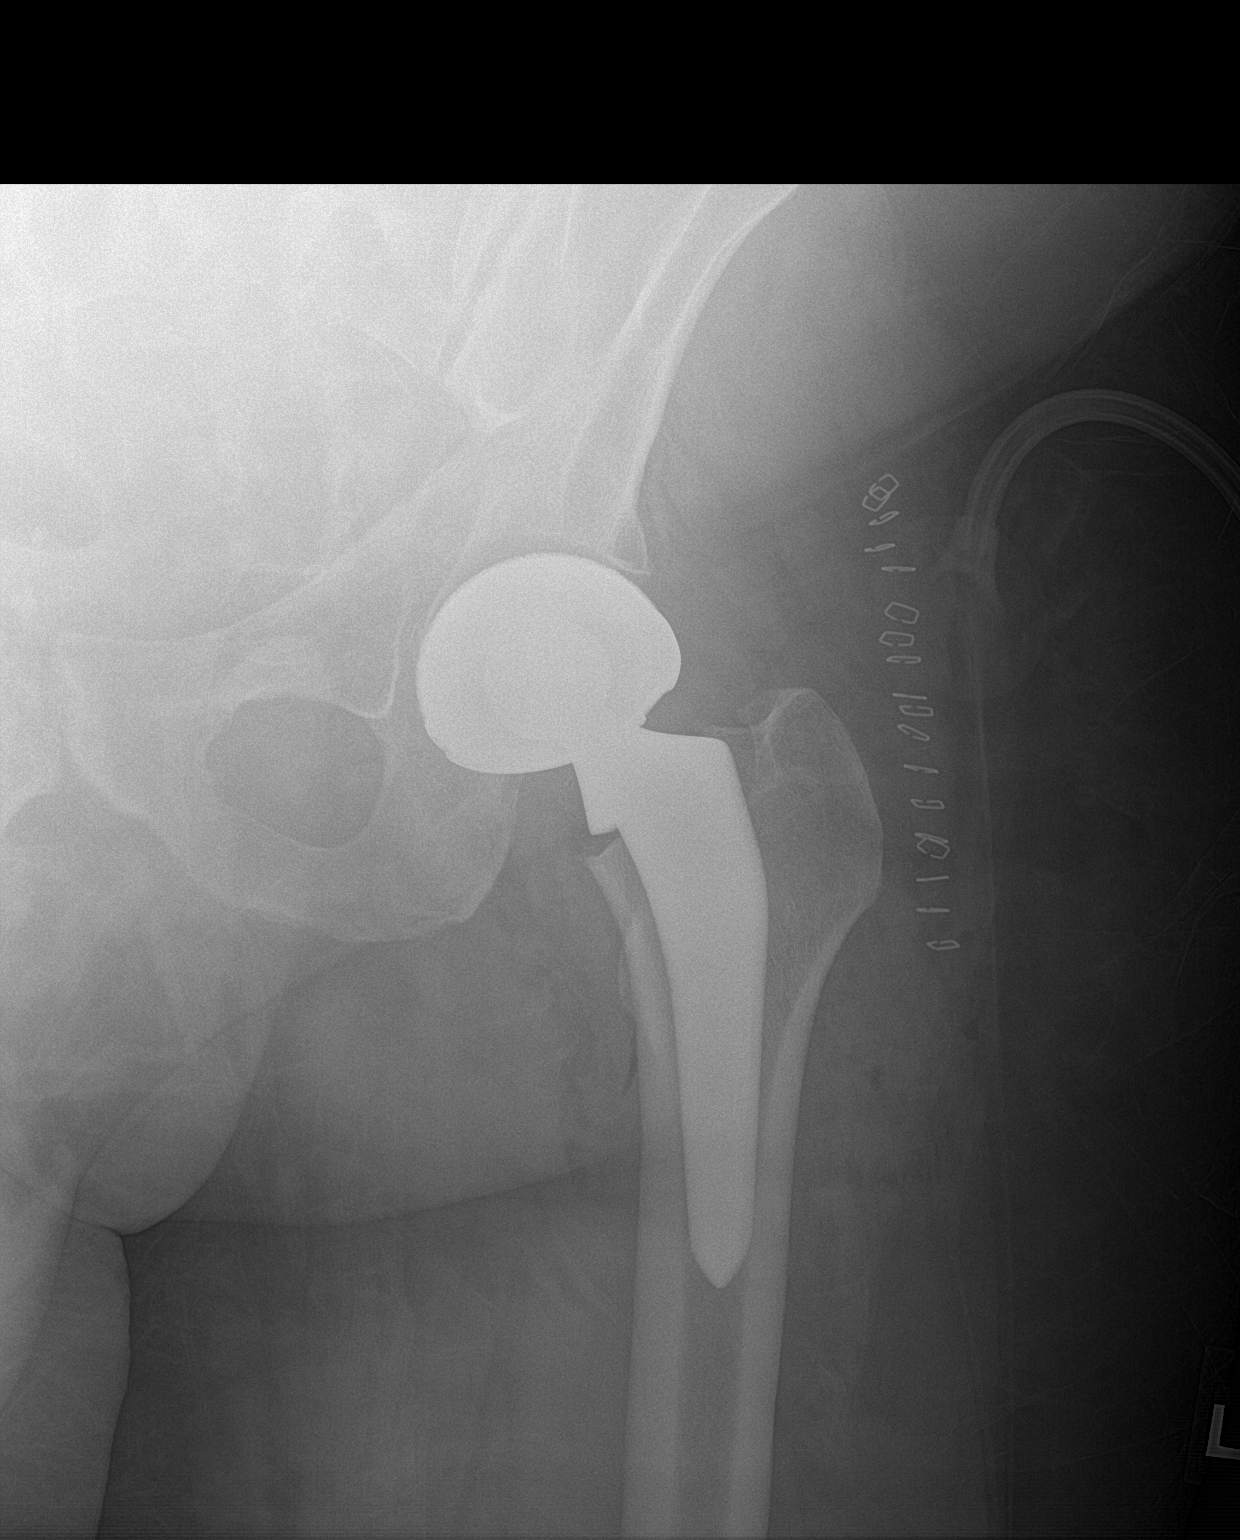

[hip lat]
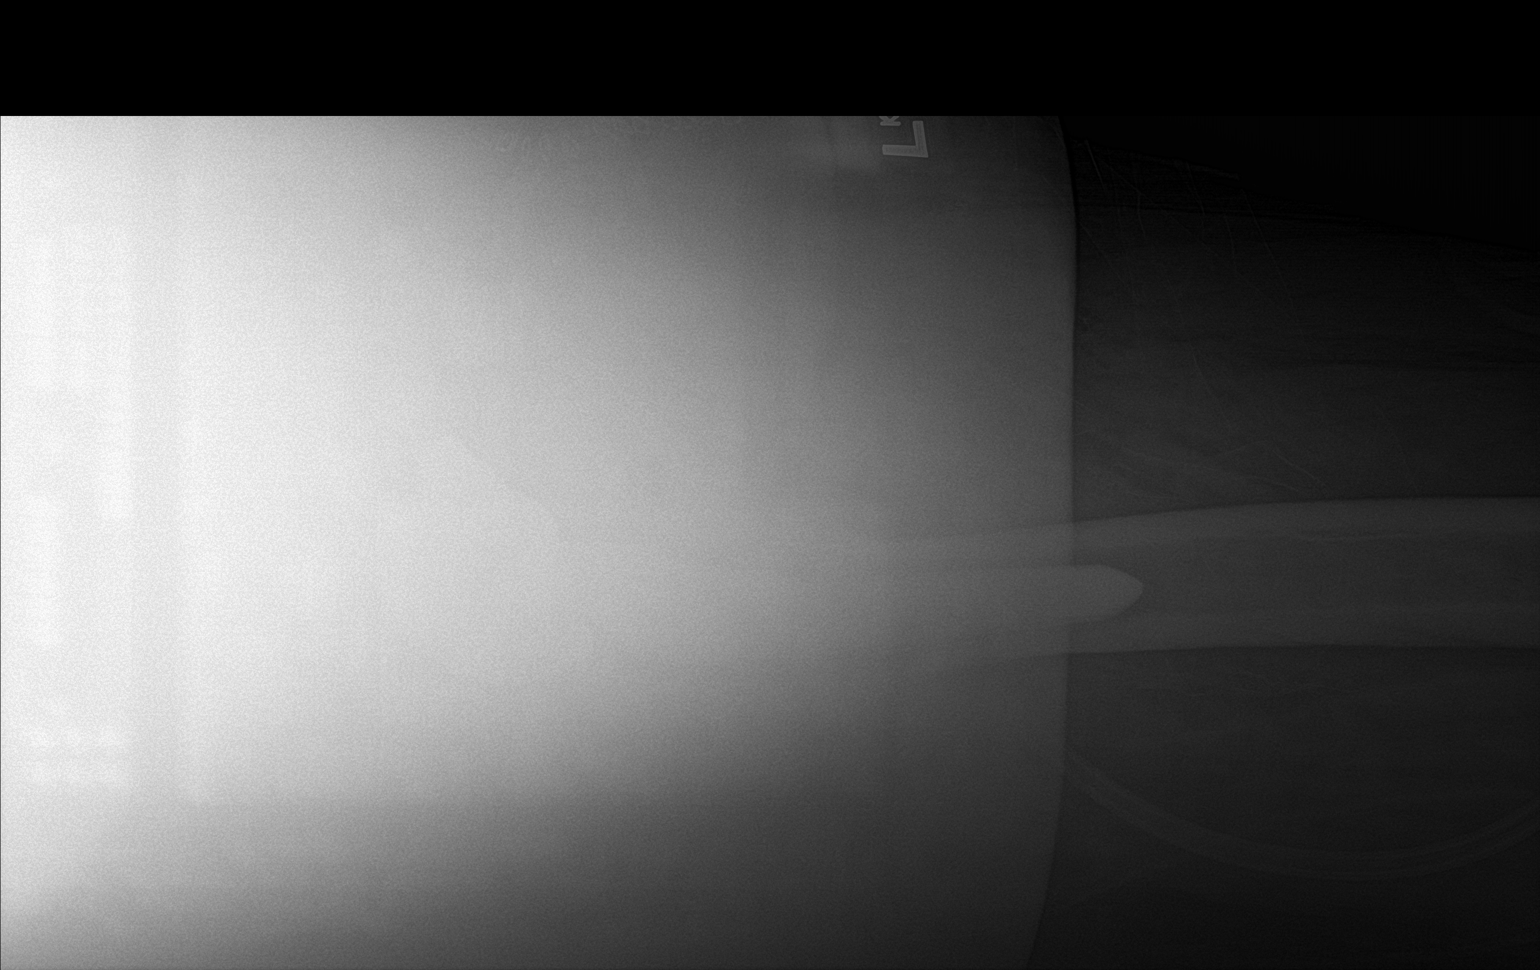

[2 of 2 positions shown; findings below may reference images not displayed]

FINDINGS: The left femoral and acetabular components are well situated.
Expected postoperative changes are seen in the surrounding soft
tissues.
IMPRESSION: Status post left total hip arthroplasty.

## 2023-08-26 IMAGING — RF DG HIP (WITH PELVIS) OPERATIVE*L*
1 series · 4 of 4 positions shown · non-contrast
Comparison: CT scan 09/20/2020

CLINICAL DATA: Left total hip replacement.

EXAM:
OPERATIVE left HIP (WITH PELVIS IF PERFORMED) for VIEWS
TECHNIQUE: Fluoroscopic spot image(s) were submitted for interpretation
post-operatively.

[Series 1: dg x-ray · 0.20mm/px · 4 of 4 slices shown]
[im 1/4]
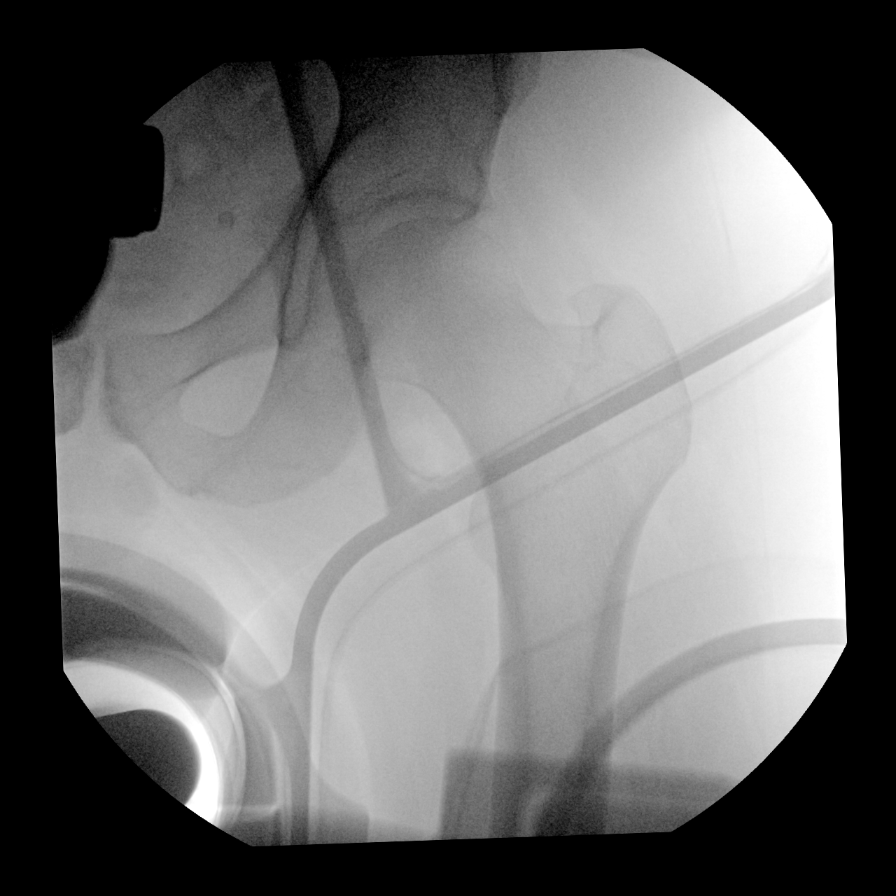
[im 2/4]
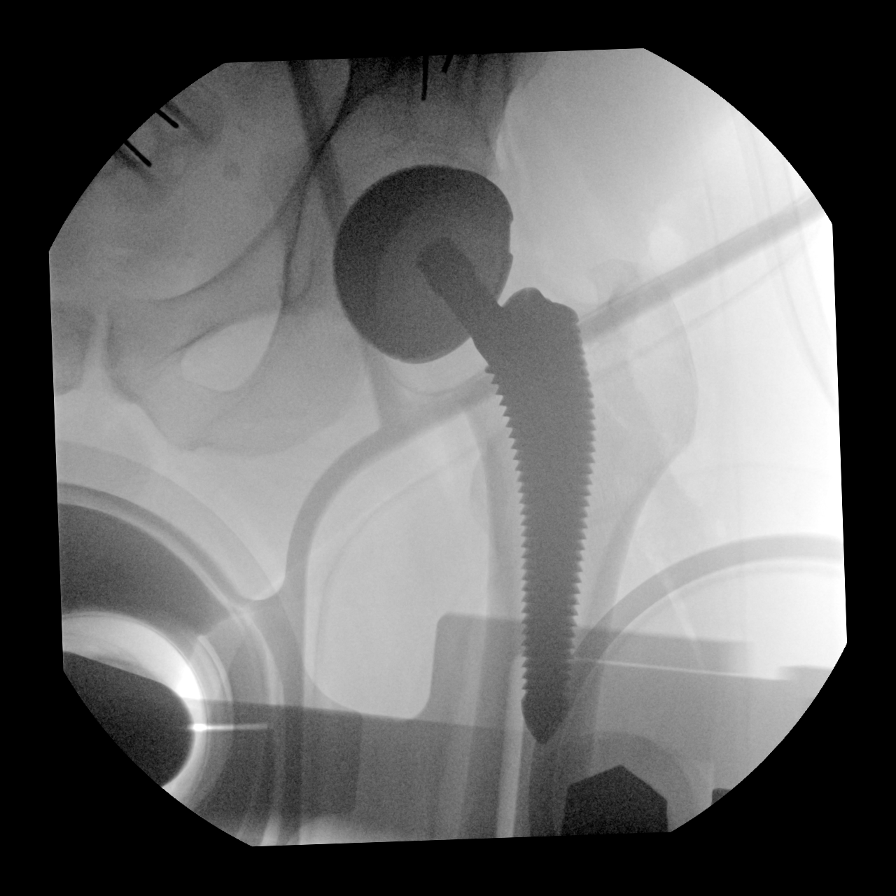
[im 3/4]
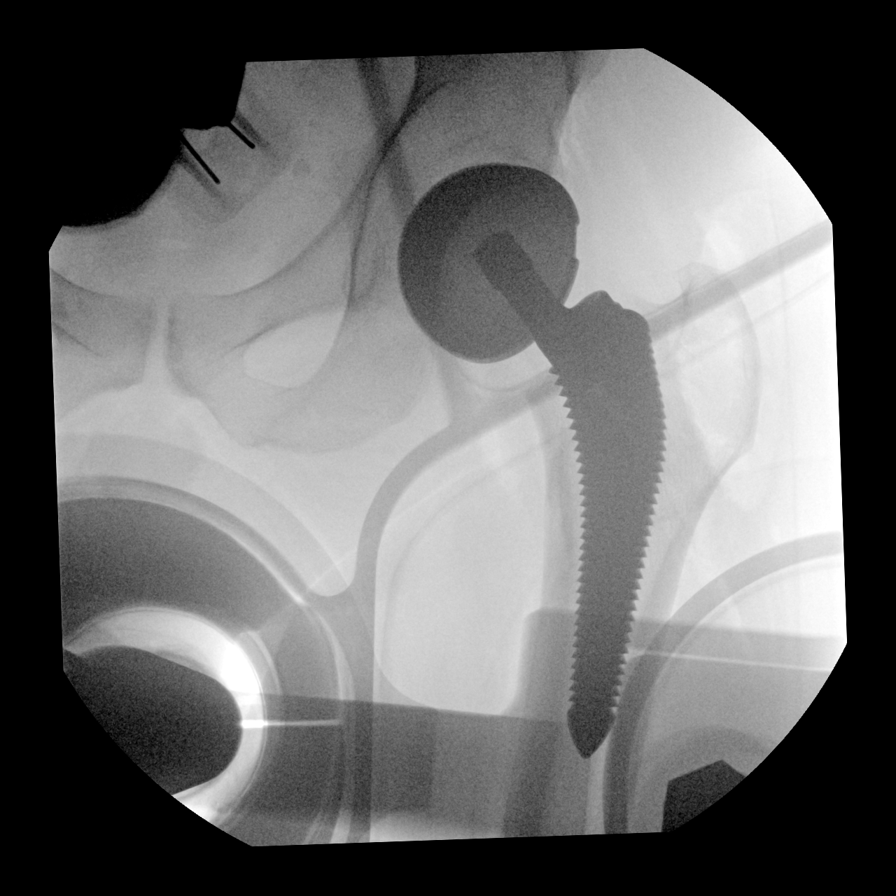
[im 4/4]
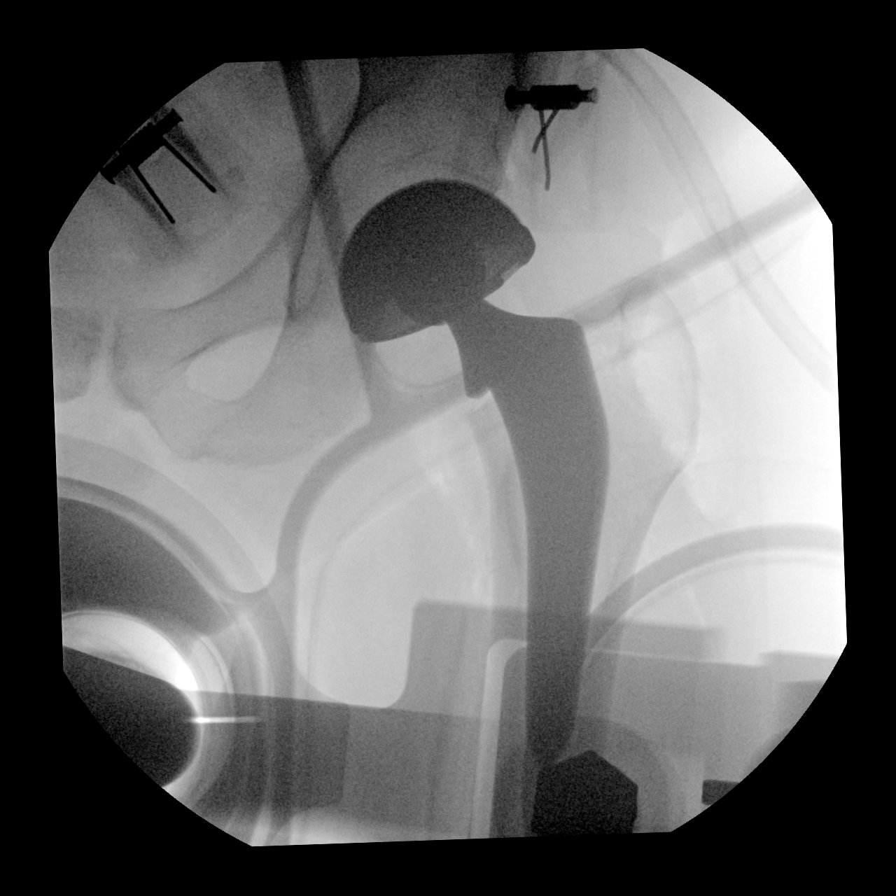

[4 of 4 positions shown; findings below may reference images not displayed]

FINDINGS: Intraoperative spot fluoro images obtained during left total hip
replacement. Final image shows no evidence for immediate hardware
complication.
IMPRESSION: Intraoperative assessment during left total hip replacement.
